# Patient Record
Sex: Female | Born: 1950
Health system: Southern US, Community
[De-identification: ages and names within clinical notes are randomized; demographics above are authoritative.]

## PROBLEM LIST (undated history)

## (undated) DIAGNOSIS — Z91018 Allergy to other foods: Secondary | ICD-10-CM

## (undated) HISTORY — PX: WRIST FRACTURE SURGERY: SHX121

## (undated) HISTORY — DX: Allergy to other foods: Z91.018

## (undated) HISTORY — PX: CHOLECYSTECTOMY: SHX55

---

## 2004-02-18 ENCOUNTER — Emergency Department (HOSPITAL_COMMUNITY): Admission: EM | Admit: 2004-02-18 | Discharge: 2004-02-18 | Payer: Self-pay | Admitting: Emergency Medicine

## 2004-08-28 ENCOUNTER — Ambulatory Visit (HOSPITAL_COMMUNITY): Admission: RE | Admit: 2004-08-28 | Discharge: 2004-08-28 | Payer: Self-pay | Admitting: Pulmonary Disease

## 2004-09-05 ENCOUNTER — Ambulatory Visit (HOSPITAL_COMMUNITY): Admission: RE | Admit: 2004-09-05 | Discharge: 2004-09-05 | Payer: Self-pay | Admitting: Pulmonary Disease

## 2004-09-24 ENCOUNTER — Encounter (INDEPENDENT_AMBULATORY_CARE_PROVIDER_SITE_OTHER): Payer: Self-pay | Admitting: General Surgery

## 2004-09-24 ENCOUNTER — Observation Stay (HOSPITAL_COMMUNITY): Admission: RE | Admit: 2004-09-24 | Discharge: 2004-09-25 | Payer: Self-pay | Admitting: General Surgery

## 2007-02-11 ENCOUNTER — Other Ambulatory Visit: Admission: RE | Admit: 2007-02-11 | Discharge: 2007-02-11 | Payer: Self-pay | Admitting: Obstetrics & Gynecology

## 2007-03-02 ENCOUNTER — Encounter: Admission: RE | Admit: 2007-03-02 | Discharge: 2007-03-02 | Payer: Self-pay | Admitting: Obstetrics & Gynecology

## 2007-06-27 ENCOUNTER — Emergency Department (HOSPITAL_COMMUNITY): Admission: EM | Admit: 2007-06-27 | Discharge: 2007-06-27 | Payer: Self-pay | Admitting: Emergency Medicine

## 2010-02-04 ENCOUNTER — Encounter: Payer: Self-pay | Admitting: Pulmonary Disease

## 2010-06-01 NOTE — H&P (Signed)
NAMESANTIAGO, GRAF             ACCOUNT NO.:  [id number]   MEDICAL RECORD NO.:  [id number]           PATIENT TYPE:   LOCATION:                                 FACILITY:   PHYSICIAN:  Dalia Heading, M.D.  DATE OF BIRTH:  06-19-1950   DATE OF ADMISSION:  DATE OF DISCHARGE:  LH                                HISTORY & PHYSICAL   CHIEF COMPLAINT:  Chronic cholecystitis.   HISTORY OF PRESENT ILLNESS:  The patient is a 60 year old white female who  is referred for evaluation and treatment of biliary colic secondary to  chronic cholecystectomy.  She has been having intermittent episodes of right  upper quadrant abdominal pain with radiation to the right flank, nausea,  indigestion, and bloating for many months.  Fatty foods make it worse.  No  fever, chills, or jaundice have been noted.   PAST MEDICAL HISTORY:  Unremarkable.   PAST SURGICAL HISTORY:  Unremarkable.   CURRENT MEDICATIONS:  None.   ALLERGIES:  No known drug allergies.   REVIEW OF SYSTEMS:  Noncontributory.   PHYSICAL EXAMINATION:  GENERAL:  The patient is a well-developed, well-  nourished, white female in no acute distress.  HEENT:  No scleral icterus.  LUNGS:  Clear to auscultation with equal breath sounds bilaterally.  HEART:  A regular rate and rhythm without S3, S4, or murmurs.  ABDOMEN:  Soft with slight tenderness in the right upper quadrant to  palpation.  No hepatosplenomegaly, masses, or hernias are identified.  Ultrasound of the gallbladder is negative.  Hepatobiliary scan reveals low  gallbladder ejection fraction with reproducible symptoms with a fatty meal.   IMPRESSION:  Chronic cholecystitis.   PLAN:  The patient is scheduled for laparoscopic cholecystectomy on  September 24, 2004.  The risks and benefits of the procedure, including  bleeding, infection, hepatobiliary injury, and the possibility of an open  procedure were fully explained to the patient, who gave informed consent.      Dalia Heading, M.D.  Electronically Signed     MAJ/MEDQ  D:  09/20/2004  T:  09/21/2004  Job:  [id number]   cc:   Ramon Dredge L. Juanetta Gosling, M.D.  [address]  [place]  [place] [address]  Fax: [phone]

## 2010-06-01 NOTE — Op Note (Signed)
Jasmin Perez, Jasmin Perez             ACCOUNT NO.:  [id number]   MEDICAL RECORD NO.:  [id number]          PATIENT TYPE:  OBV   LOCATION:  A416                          FACILITY:  [facility]   PHYSICIAN:  Dalia Heading, M.D.  DATE OF BIRTH:  01/30/1950   DATE OF PROCEDURE:  09/24/2004  DATE OF DISCHARGE:                                 OPERATIVE REPORT   PREOPERATIVE DIAGNOSIS:  Chronic cholecystitis.   POSTOPERATIVE DIAGNOSIS:  Chronic cholecystitis.   PROCEDURE:  Laparoscopic cholecystectomy.   SURGEON:  Dalia Heading, M.D.   ANESTHESIA:  General endotracheal.   INDICATIONS:  The patient is a 60 year old white female who was referred for  evaluation and treatment for biliary colic secondary to chronic  cholecystitis. Risks and benefits of the procedure including bleeding,  infection, hepatobiliary injury, and the possibility of an open procedure  were fully explained to the patient, who gave informed consent.   PROCEDURE NOTE:  The patient was placed in the supine position. After  induction of general endotracheal anesthesia, the abdomen was prepped and  draped using the usual sterile technique with Betadine. Surgical site  confirmation was performed.   An infraumbilical incision was made down to fascia. Veress needle was  introduced into the abdominal cavity and confirmation of placement was done  using the saline drop test. The abdomen was then insufflated to 16 mmHg  pressure. A 11-mm trocar was introduced into the abdominal cavity under  direct visualization without difficulty. The patient was placed in reversed  Trendelenburg position. Additional 1-mm trocar was placed in the epigastric  region and 5-mm trocars were placed in the right upper quadrant and right  flank regions. Liver was inspected, noted to be within normal limits. The  gallbladder was retracted superiorly and laterally. The dissection was begun  around the infundibulum of the gallbladder. Cystic duct was  first  identified. Its juncture to the infundibulum fully identified. Endoclips  were placed proximally and distally on the cystic duct and the cystic duct  was divided. This was likewise done in the cystic artery. The gallbladder  was then freed away from the gallbladder fossa using Bovie electrocautery.  The gallbladder was delivered through the epigastric trocar site using  EndoCatch bag. The gallbladder fossa was inspected. No abnormal bleeding or  bile leakage was noted. Surgicel was placed in the gallbladder fossa. All  fluid and air were then evacuated from the abdominal cavity prior to removal  of the trocars.   All wounds were irrigated with normal saline. All wounds were injected with  0.5% Sensorcaine. The infraumbilical fascia was reapproximated using a 0  Vicryl interrupted suture. All skin incisions were closed using staples.  Betadine ointment and dry sterile dressings were applied.   All tape and needle counts were correct at the end of the procedure. The  patient was extubated in the operating room and went back to recovery room  awake and in stable condition.   COMPLICATIONS:  None.   SPECIMEN:  Gallbladder.   ESTIMATED BLOOD LOSS:  Minimal.      Dalia Heading, M.D.  Electronically Signed  MAJ/MEDQ  D:  09/24/2004  T:  09/24/2004  Job:  [id number]   cc:   Ramon Dredge L. Juanetta Gosling, M.D.  [address]  [place]  [place] [address]  Fax: [phone]

## 2011-10-24 ENCOUNTER — Ambulatory Visit: Payer: Self-pay | Admitting: General Practice

## 2012-12-30 ENCOUNTER — Ambulatory Visit: Payer: Self-pay | Admitting: Family Medicine

## 2014-12-13 DIAGNOSIS — Z0000 Encounter for general adult medical examination without abnormal findings: Secondary | ICD-10-CM | POA: Diagnosis not present

## 2014-12-13 DIAGNOSIS — Z6824 Body mass index (BMI) 24.0-24.9, adult: Secondary | ICD-10-CM | POA: Diagnosis not present

## 2014-12-13 DIAGNOSIS — E782 Mixed hyperlipidemia: Secondary | ICD-10-CM | POA: Diagnosis not present

## 2015-08-08 ENCOUNTER — Encounter (HOSPITAL_COMMUNITY): Payer: Self-pay | Admitting: *Deleted

## 2015-08-08 ENCOUNTER — Emergency Department (HOSPITAL_COMMUNITY): Payer: Commercial Managed Care - HMO

## 2015-08-08 ENCOUNTER — Emergency Department (HOSPITAL_COMMUNITY)
Admission: EM | Admit: 2015-08-08 | Discharge: 2015-08-08 | Disposition: A | Discharge status: Home / Self Care | Payer: Commercial Managed Care - HMO | Attending: Emergency Medicine | Admitting: Emergency Medicine

## 2015-08-08 DIAGNOSIS — R1010 Upper abdominal pain, unspecified: Secondary | ICD-10-CM | POA: Diagnosis not present

## 2015-08-08 DIAGNOSIS — R1011 Right upper quadrant pain: Secondary | ICD-10-CM | POA: Insufficient documentation

## 2015-08-08 DIAGNOSIS — Z79899 Other long term (current) drug therapy: Secondary | ICD-10-CM | POA: Diagnosis not present

## 2015-08-08 LAB — CBC WITH DIFFERENTIAL/PLATELET
BASOS ABS: 0 10*3/uL (ref 0.0–0.1)
BASOS PCT: 1 %
Eosinophils Absolute: 0.2 10*3/uL (ref 0.0–0.7)
Eosinophils Relative: 4 %
HEMATOCRIT: 43.5 % (ref 36.0–46.0)
HEMOGLOBIN: 14.8 g/dL (ref 12.0–15.0)
Lymphocytes Relative: 37 %
Lymphs Abs: 1.7 10*3/uL (ref 0.7–4.0)
MCH: 29.2 pg (ref 26.0–34.0)
MCHC: 34 g/dL (ref 30.0–36.0)
MCV: 85.8 fL (ref 78.0–100.0)
MONOS PCT: 11 %
Monocytes Absolute: 0.5 10*3/uL (ref 0.1–1.0)
NEUTROS ABS: 2.1 10*3/uL (ref 1.7–7.7)
NEUTROS PCT: 47 %
Platelets: 184 10*3/uL (ref 150–400)
RBC: 5.07 MIL/uL (ref 3.87–5.11)
RDW: 13.6 % (ref 11.5–15.5)
WBC: 4.5 10*3/uL (ref 4.0–10.5)

## 2015-08-08 LAB — URINE MICROSCOPIC-ADD ON
Squamous Epithelial / LPF: NONE SEEN
WBC UA: NONE SEEN WBC/hpf (ref 0–5)

## 2015-08-08 LAB — COMPREHENSIVE METABOLIC PANEL
ALT: 19 U/L (ref 14–54)
AST: 23 U/L (ref 15–41)
Albumin: 4.2 g/dL (ref 3.5–5.0)
Alkaline Phosphatase: 67 U/L (ref 38–126)
BUN: 19 mg/dL (ref 6–20)
CALCIUM: 9.1 mg/dL (ref 8.9–10.3)
CHLORIDE: 108 mmol/L (ref 101–111)
CO2: 28 mmol/L (ref 22–32)
Creatinine, Ser: 0.71 mg/dL (ref 0.44–1.00)
Glucose, Bld: 89 mg/dL (ref 65–99)
POTASSIUM: 3.7 mmol/L (ref 3.5–5.1)
Sodium: 137 mmol/L (ref 135–145)
Total Bilirubin: 0.6 mg/dL (ref 0.3–1.2)
Total Protein: 6.9 g/dL (ref 6.5–8.1)

## 2015-08-08 LAB — URINALYSIS, ROUTINE W REFLEX MICROSCOPIC
Bilirubin Urine: NEGATIVE
Glucose, UA: NEGATIVE mg/dL
KETONES UR: NEGATIVE mg/dL
LEUKOCYTES UA: NEGATIVE
NITRITE: NEGATIVE
PH: 5.5 (ref 5.0–8.0)
Protein, ur: NEGATIVE mg/dL
SPECIFIC GRAVITY, URINE: 1.025 (ref 1.005–1.030)

## 2015-08-08 LAB — LIPASE, BLOOD: LIPASE: 28 U/L (ref 11–51)

## 2015-08-08 MED ORDER — PANTOPRAZOLE SODIUM 20 MG PO TBEC: 20.0000 mg | DELAYED_RELEASE_TABLET | Freq: Every day | ORAL | 0 refills | Status: DC

## 2015-08-08 MED ORDER — IOPAMIDOL (ISOVUE-300) INJECTION 61%
100.0000 mL | Freq: Once | INTRAVENOUS | Status: AC | PRN
  Administered 2015-08-08: 100 mL via INTRAVENOUS

## 2015-08-08 NOTE — ED Triage Notes (Signed)
Pt c/o right upper quad pain that has gotten progressively worse; pt states the pain is worse when she lies on her stomach; pt denies any n/v or urinary sx

## 2015-08-08 NOTE — ED Provider Notes (Signed)
[facility] DEPT Provider Note   CSN: [id number] Arrival date & time: 08/08/15  0607  First Provider Contact:  First MD Initiated Contact with Patient 08/08/15 0745        History   Chief Complaint Chief Complaint  Patient presents with  . Abdominal Pain    HPI Jasmin Perez is a 65 y.o. female.  Patient complains of some right upper quadrant pain for couple weeks. No fever no nausea no vomiting. Patient has had her gallbladder out   The history is provided by the patient.  Abdominal Pain   This is a new problem. The current episode started more than 1 week ago. The problem occurs constantly. The problem has not changed since onset.The pain is associated with an unknown factor. The pain is located in the RUQ. The pain is at a severity of 4/10. The pain is moderate. Pertinent negatives include anorexia, diarrhea, frequency, hematuria and headaches. Nothing aggravates the symptoms. Nothing relieves the symptoms. Past workup does not include GI consult. Her past medical history is significant for gallstones.    History reviewed. No pertinent past medical history.  There are no active problems to display for this patient.   Past Surgical History:  Procedure Laterality Date  . CHOLECYSTECTOMY    . WRIST FRACTURE SURGERY Right     OB History    No data available       Home Medications    Prior to Admission medications   Medication Sig Start Date End Date Taking? Authorizing Provider  calcium-vitamin D (OSCAL WITH D) 500-200 MG-UNIT tablet Take 1 tablet by mouth daily.   Yes Historical Provider, MD  Cholecalciferol (VITAMIN D PO) Take 1 tablet by mouth daily.   Yes Historical Provider, MD  magnesium oxide (MAG-OX) 400 MG tablet Take 400 mg by mouth daily.   Yes Historical Provider, MD  Multiple Vitamins-Minerals (MULTIVITAMINS THER. W/MINERALS) TABS tablet Take 1 tablet by mouth daily.   Yes Historical Provider, MD  Multiple Vitamins-Minerals (ZINC PO) Take 1  tablet by mouth daily.   Yes Historical Provider, MD  VITAMIN E PO Take 1 tablet by mouth daily.   Yes Historical Provider, MD  pantoprazole (PROTONIX) 20 MG tablet Take 1 tablet (20 mg total) by mouth daily. 08/08/15   Milton Ferguson, MD    Family History History reviewed. No pertinent family history.  Social History Social History  Substance Use Topics  . Smoking status: Never Smoker  . Smokeless tobacco: Never Used  . Alcohol use No     Allergies   Peanut-containing drug products   Review of Systems Review of Systems  Constitutional: Negative for appetite change and fatigue.  HENT: Negative for congestion, ear discharge and sinus pressure.   Eyes: Negative for discharge.  Respiratory: Negative for cough.   Cardiovascular: Negative for chest pain.  Gastrointestinal: Positive for abdominal pain. Negative for anorexia and diarrhea.  Genitourinary: Negative for frequency and hematuria.  Musculoskeletal: Negative for back pain.  Skin: Negative for rash.  Neurological: Negative for seizures and headaches.  Psychiatric/Behavioral: Negative for hallucinations.     Physical Exam Updated Vital Signs BP 114/63 (BP Location: Left Arm)   Pulse 60   Temp 98.3 F (36.8 C) (Oral)   Resp 18   Ht 5\' 5"  (1.651 m)   Wt 150 lb (68 kg)   SpO2 100%   BMI 24.96 kg/m   Physical Exam  Constitutional: She is oriented to person, place, and time. She appears well-developed.  HENT:  Head: Normocephalic.  Eyes: Conjunctivae and EOM are normal. No scleral icterus.  Neck: Neck supple. No thyromegaly present.  Cardiovascular: Normal rate and regular rhythm.  Exam reveals no gallop and no friction rub.   No murmur heard. Pulmonary/Chest: No stridor. She has no wheezes. She has no rales. She exhibits no tenderness.  Abdominal: She exhibits no distension. There is no tenderness. There is no rebound.  Mild right upper quadrant tenderness  Musculoskeletal: Normal range of motion. She exhibits  no edema.  Lymphadenopathy:    She has no cervical adenopathy.  Neurological: She is oriented to person, place, and time. She exhibits normal muscle tone. Coordination normal.  Skin: No rash noted. No erythema.  Psychiatric: She has a normal mood and affect. Her behavior is normal.     ED Treatments / Results  Labs (all labs ordered are listed, but only abnormal results are displayed) Labs Reviewed  URINALYSIS, ROUTINE W REFLEX MICROSCOPIC (NOT AT [facility]) - Abnormal; Notable for the following:       Result Value   Hgb urine dipstick MODERATE (*)    All other components within normal limits  URINE MICROSCOPIC-ADD ON - Abnormal; Notable for the following:    Bacteria, UA FEW (*)    All other components within normal limits  URINE CULTURE  LIPASE, BLOOD  COMPREHENSIVE METABOLIC PANEL  CBC WITH DIFFERENTIAL/PLATELET    EKG  EKG Interpretation None       Radiology Ct Abdomen Pelvis W Contrast  Result Date: 08/08/2015 CLINICAL DATA:  Right upper quadrant pain for 3 weeks. EXAM: CT ABDOMEN AND PELVIS WITH CONTRAST TECHNIQUE: Multidetector CT imaging of the abdomen and pelvis was performed using the standard protocol following bolus administration of intravenous contrast. CONTRAST:  143mL ISOVUE-300 IOPAMIDOL (ISOVUE-300) INJECTION 61% COMPARISON:  None. FINDINGS: Lower chest: Bibasilar scar. Mild cardiomegaly, without pericardial or pleural effusion. Hepatobiliary: Too small to characterize right hepatic lobe lesion on image 18/series 2 measures on the order of 6 mm. Pancreas: Normal, without mass or ductal dilatation. Spleen: Normal in size, without focal abnormality. Adrenals/Urinary Tract: Normal adrenal glands. Normal kidneys, without hydronephrosis. Normal urinary bladder. Stomach/Bowel: Normal stomach, without wall thickening. Colonic stool burden suggests constipation. A subtle small bowel intussusception on image 28/series 2 and coronal image 47 is likely transient, not identified  on delayed images. Vascular/Lymphatic: Aortic and branch vessel atherosclerosis. No abdominopelvic adenopathy. Reproductive: Normal uterus and adnexa. Other: No significant free fluid. Musculoskeletal: Likely degenerative or remote posttraumatic sclerosis about the posterior left iliac. Degenerative disc disease at the lumbosacral junction with trace L4-5 anterolisthesis. Disc bulges at L4-5 and L5-S1. Mild convex right lumbar spine curvature IMPRESSION: 1.  Possible constipation. 2. No other explanation for right-sided pain. 3.  Aortic atherosclerosis. Electronically Signed   By: Abigail Miyamoto M.D.   On: 08/08/2015 13:44   Procedures Procedures (including critical care time)  Medications Ordered in ED Medications  iopamidol (ISOVUE-300) 61 % injection 100 mL (100 mLs Intravenous Contrast Given 08/08/15 1310)     Initial Impression / Assessment and Plan / ED Course  I have reviewed the triage vital signs and the nursing notes.  Pertinent labs & imaging results that were available during my care of the patient were reviewed by me and considered in my medical decision making (see chart for details).  Clinical Course    Labs including CT of abdomen negative except for some blood in her urine. Patient will have urine culture done be put on a PPI and will follow-up  with PCP  Final Clinical Impressions(s) / ED Diagnoses   Final diagnoses:  Pain of upper abdomen    New Prescriptions New Prescriptions   PANTOPRAZOLE (PROTONIX) 20 MG TABLET    Take 1 tablet (20 mg total) by mouth daily.     Milton Ferguson, MD 08/08/15 1409

## 2015-08-08 NOTE — Discharge Instructions (Signed)
Follow up with your md as planned °

## 2015-08-10 LAB — URINE CULTURE

## 2015-08-24 DIAGNOSIS — R1030 Lower abdominal pain, unspecified: Secondary | ICD-10-CM | POA: Diagnosis not present

## 2015-10-16 DIAGNOSIS — E785 Hyperlipidemia, unspecified: Secondary | ICD-10-CM | POA: Diagnosis not present

## 2015-10-16 DIAGNOSIS — Z0000 Encounter for general adult medical examination without abnormal findings: Secondary | ICD-10-CM | POA: Diagnosis not present

## 2015-11-06 DIAGNOSIS — Z0000 Encounter for general adult medical examination without abnormal findings: Secondary | ICD-10-CM | POA: Diagnosis not present

## 2016-03-05 DIAGNOSIS — K1120 Sialoadenitis, unspecified: Secondary | ICD-10-CM | POA: Diagnosis not present

## 2016-03-08 ENCOUNTER — Other Ambulatory Visit (HOSPITAL_COMMUNITY): Payer: Self-pay | Admitting: Pulmonary Disease

## 2016-03-11 ENCOUNTER — Other Ambulatory Visit (HOSPITAL_COMMUNITY): Payer: Self-pay | Admitting: Pulmonary Disease

## 2016-03-11 DIAGNOSIS — K119 Disease of salivary gland, unspecified: Secondary | ICD-10-CM

## 2016-03-20 ENCOUNTER — Ambulatory Visit (HOSPITAL_COMMUNITY)
Admission: RE | Admit: 2016-03-20 | Discharge: 2016-03-20 | Disposition: A | Discharge status: Home / Self Care | Payer: Medicare HMO | Source: Ambulatory Visit | Attending: Pulmonary Disease | Admitting: Pulmonary Disease

## 2016-03-20 DIAGNOSIS — K119 Disease of salivary gland, unspecified: Secondary | ICD-10-CM

## 2016-03-20 DIAGNOSIS — J984 Other disorders of lung: Secondary | ICD-10-CM | POA: Diagnosis not present

## 2016-03-20 DIAGNOSIS — R221 Localized swelling, mass and lump, neck: Secondary | ICD-10-CM | POA: Diagnosis not present

## 2016-03-20 LAB — POCT I-STAT CREATININE: Creatinine, Ser: 0.8 mg/dL (ref 0.44–1.00)

## 2016-03-20 MED ORDER — IOPAMIDOL (ISOVUE-300) INJECTION 61%
75.0000 mL | Freq: Once | INTRAVENOUS | Status: AC | PRN
  Administered 2016-03-20: 75 mL via INTRAVENOUS

## 2016-08-12 DIAGNOSIS — B029 Zoster without complications: Secondary | ICD-10-CM | POA: Diagnosis not present

## 2016-08-14 DIAGNOSIS — B029 Zoster without complications: Secondary | ICD-10-CM | POA: Diagnosis not present

## 2016-11-28 DIAGNOSIS — L2084 Intrinsic (allergic) eczema: Secondary | ICD-10-CM | POA: Diagnosis not present

## 2016-11-28 DIAGNOSIS — R194 Change in bowel habit: Secondary | ICD-10-CM | POA: Diagnosis not present

## 2016-11-28 DIAGNOSIS — E785 Hyperlipidemia, unspecified: Secondary | ICD-10-CM | POA: Diagnosis not present

## 2016-11-28 DIAGNOSIS — K210 Gastro-esophageal reflux disease with esophagitis: Secondary | ICD-10-CM | POA: Diagnosis not present

## 2016-11-28 DIAGNOSIS — R196 Halitosis: Secondary | ICD-10-CM | POA: Diagnosis not present

## 2016-11-28 DIAGNOSIS — R197 Diarrhea, unspecified: Secondary | ICD-10-CM | POA: Diagnosis not present

## 2016-11-29 DIAGNOSIS — R196 Halitosis: Secondary | ICD-10-CM | POA: Diagnosis not present

## 2016-11-29 DIAGNOSIS — R194 Change in bowel habit: Secondary | ICD-10-CM | POA: Diagnosis not present

## 2016-11-29 DIAGNOSIS — E785 Hyperlipidemia, unspecified: Secondary | ICD-10-CM | POA: Diagnosis not present

## 2016-11-29 DIAGNOSIS — L2084 Intrinsic (allergic) eczema: Secondary | ICD-10-CM | POA: Diagnosis not present

## 2016-11-29 DIAGNOSIS — K210 Gastro-esophageal reflux disease with esophagitis: Secondary | ICD-10-CM | POA: Diagnosis not present

## 2016-11-29 DIAGNOSIS — R197 Diarrhea, unspecified: Secondary | ICD-10-CM | POA: Diagnosis not present

## 2016-12-12 ENCOUNTER — Encounter (INDEPENDENT_AMBULATORY_CARE_PROVIDER_SITE_OTHER): Payer: Self-pay | Admitting: Internal Medicine

## 2016-12-26 ENCOUNTER — Encounter (INDEPENDENT_AMBULATORY_CARE_PROVIDER_SITE_OTHER): Payer: Self-pay

## 2016-12-26 ENCOUNTER — Encounter (INDEPENDENT_AMBULATORY_CARE_PROVIDER_SITE_OTHER): Payer: Self-pay | Admitting: Internal Medicine

## 2016-12-26 ENCOUNTER — Ambulatory Visit (INDEPENDENT_AMBULATORY_CARE_PROVIDER_SITE_OTHER): Payer: Commercial Managed Care - HMO | Admitting: Internal Medicine

## 2016-12-26 VITALS — BP 110/80 | HR 64 | Temp 97.8°F | Resp 18 | Ht 64.5 in | Wt 155.0 lb

## 2016-12-26 DIAGNOSIS — Z800 Family history of malignant neoplasm of digestive organs: Secondary | ICD-10-CM

## 2016-12-26 DIAGNOSIS — R195 Other fecal abnormalities: Secondary | ICD-10-CM

## 2016-12-26 NOTE — Progress Notes (Signed)
   Subjective:    Patient ID: Jasmin Perez, female    DOB: [birth year], 66 y.o.   MRN: [id number]  HPI Referred by Dr. Luan Pulling for change in stool/diarrhea. She states she has white organisms in her stool.  She tells the organisms are sticking out of her stool (pictures) Change in her stools for about 4 weeks. She has a BM every 2 days.  Stools are nice and formed except for these white strings in her stools. She sees the string like projections every time she has a BM.  Her appetite is good. No weight loss. She thinks she may have seen some blood a couple of weeks ago.  She has never had a colonoscopy. Stool test from [facility] was negative in September. Nephew had intestinal cancer at age 23 and is now deceased from same.   Has been in [place] and [place] around the last of August and 1st of September. In September she was in [place] for a week. 12/03/2016 Stool studies negative. Ova and parasites negative. 11/28/2016 H and H  14.0 and 41.9.    08/07/2016 CT abdomen/pelvis with CM: RUQ pain; IMPRESSION: 1.  Possible constipation. 2. No other explanation for right-sided pain. 3.  Aortic atherosclerosis. Review of Systems History reviewed. No pertinent past medical history.  Past Surgical History:  Procedure Laterality Date  . CHOLECYSTECTOMY    . WRIST FRACTURE SURGERY Right     Allergies  Allergen Reactions  . Peanut-Containing Drug Products     Current Outpatient Medications on File Prior to Visit  Medication Sig Dispense Refill  . calcium-vitamin D (OSCAL WITH D) 500-200 MG-UNIT tablet Take 1 tablet by mouth daily.    . Cholecalciferol (VITAMIN D PO) Take 1 tablet by mouth daily.    . magnesium oxide (MAG-OX) 400 MG tablet Take 400 mg by mouth daily.    . Multiple Vitamins-Minerals (MULTIVITAMINS THER. W/MINERALS) TABS tablet Take 1 tablet by mouth daily.    . Multiple Vitamins-Minerals (ZINC PO) Take 1 tablet by mouth daily.    . vitamin C (ASCORBIC ACID) 500 MG tablet  Take by mouth daily.    Marland Kitchen VITAMIN E PO Take 1 tablet by mouth daily.     No current facility-administered medications on file prior to visit.         Objective:   Physical Exam Blood pressure 110/80, pulse 64, temperature 97.8 F (36.6 C), temperature source Oral, resp. rate 18, height 5' 4.5" (1.638 m), weight 155 lb (70.3 kg). Alert and oriented. Skin warm and dry. Oral mucosa is moist.   . Sclera anicteric, conjunctivae is pink. Thyroid not enlarged. No cervical lymphadenopathy. Lungs clear. Heart regular rate and rhythm.  Abdomen is soft. Bowel sounds are positive. No hepatomegaly. No abdominal masses felt. No tenderness.  No edema to lower extremities.           Assessment & Plan:  Change in stool. Will get an Ova and Para. Family hx of colon cancer. Needs to consider a colonoscopy at some point.

## 2016-12-26 NOTE — Patient Instructions (Addendum)
Ova and Para.

## 2016-12-27 LAB — OVA AND PARASITE EXAMINATION
CONCENTRATE RESULT: NONE SEEN
SPECIMEN QUALITY:: ADEQUATE
TRICHROME RESULT: NONE SEEN
VKL: 81402336

## 2017-01-09 DIAGNOSIS — R197 Diarrhea, unspecified: Secondary | ICD-10-CM | POA: Diagnosis not present

## 2017-01-23 ENCOUNTER — Telehealth (INDEPENDENT_AMBULATORY_CARE_PROVIDER_SITE_OTHER): Payer: Self-pay | Admitting: Internal Medicine

## 2017-01-23 NOTE — Telephone Encounter (Signed)
Patient called lmoam regarding several results listed in [place].  [phone]

## 2017-01-27 ENCOUNTER — Telehealth (INDEPENDENT_AMBULATORY_CARE_PROVIDER_SITE_OTHER): Payer: Self-pay | Admitting: Internal Medicine

## 2017-01-27 ENCOUNTER — Other Ambulatory Visit (INDEPENDENT_AMBULATORY_CARE_PROVIDER_SITE_OTHER): Payer: Self-pay | Admitting: Internal Medicine

## 2017-01-27 DIAGNOSIS — R195 Other fecal abnormalities: Secondary | ICD-10-CM

## 2017-01-27 NOTE — Telephone Encounter (Signed)
Patient presented to the office today and [name] spoke to her.

## 2017-01-27 NOTE — Telephone Encounter (Signed)
err

## 2017-01-27 NOTE — Telephone Encounter (Signed)
Stool study ordered

## 2017-01-28 DIAGNOSIS — R195 Other fecal abnormalities: Secondary | ICD-10-CM | POA: Diagnosis not present

## 2017-01-29 LAB — GIARDIA/CRYPTOSPORIDIUM (EIA)
MICRO NUMBER: 90060166
MICRO NUMBER: 90060167
RESULT: NOT DETECTED
RESULT:: NOT DETECTED
SPECIMEN QUALITY: ADEQUATE
SPECIMEN QUALITY:: ADEQUATE

## 2017-02-11 ENCOUNTER — Other Ambulatory Visit (INDEPENDENT_AMBULATORY_CARE_PROVIDER_SITE_OTHER): Payer: Self-pay | Admitting: Internal Medicine

## 2017-02-11 DIAGNOSIS — R1011 Right upper quadrant pain: Secondary | ICD-10-CM | POA: Diagnosis not present

## 2017-02-11 DIAGNOSIS — R1013 Epigastric pain: Secondary | ICD-10-CM | POA: Diagnosis not present

## 2017-02-11 LAB — COMPREHENSIVE METABOLIC PANEL
AG Ratio: 2 (calc) (ref 1.0–2.5)
ALKALINE PHOSPHATASE (APISO): 82 U/L (ref 33–130)
ALT: 15 U/L (ref 6–29)
AST: 19 U/L (ref 10–35)
Albumin: 4.7 g/dL (ref 3.6–5.1)
BUN/Creatinine Ratio: 16 (calc) (ref 6–22)
BUN: 18 mg/dL (ref 7–25)
CALCIUM: 9.6 mg/dL (ref 8.6–10.4)
CHLORIDE: 107 mmol/L (ref 98–110)
CO2: 28 mmol/L (ref 20–32)
CREATININE: 1.14 mg/dL — AB (ref 0.50–0.99)
GLOBULIN: 2.4 g/dL (ref 1.9–3.7)
Glucose, Bld: 101 mg/dL — ABNORMAL HIGH (ref 65–99)
Potassium: 4.3 mmol/L (ref 3.5–5.3)
Sodium: 144 mmol/L (ref 135–146)
Total Bilirubin: 0.6 mg/dL (ref 0.2–1.2)
Total Protein: 7.1 g/dL (ref 6.1–8.1)

## 2017-02-11 LAB — CBC WITH DIFFERENTIAL/PLATELET
BASOS ABS: 30 {cells}/uL (ref 0–200)
Basophils Relative: 0.4 %
EOS PCT: 3.7 %
Eosinophils Absolute: 281 cells/uL (ref 15–500)
HCT: 42.4 % (ref 35.0–45.0)
Hemoglobin: 14.7 g/dL (ref 11.7–15.5)
Lymphs Abs: 1611 cells/uL (ref 850–3900)
MCH: 28.7 pg (ref 27.0–33.0)
MCHC: 34.7 g/dL (ref 32.0–36.0)
MCV: 82.8 fL (ref 80.0–100.0)
MONOS PCT: 7.5 %
MPV: 9.3 fL (ref 7.5–12.5)
NEUTROS ABS: 5107 {cells}/uL (ref 1500–7800)
Neutrophils Relative %: 67.2 %
PLATELETS: 268 10*3/uL (ref 140–400)
RBC: 5.12 10*6/uL — ABNORMAL HIGH (ref 3.80–5.10)
RDW: 13 % (ref 11.0–15.0)
TOTAL LYMPHOCYTE: 21.2 %
WBC mixed population: 570 cells/uL (ref 200–950)
WBC: 7.6 10*3/uL (ref 3.8–10.8)

## 2017-02-11 LAB — AMYLASE: AMYLASE: 37 U/L (ref 21–101)

## 2017-02-11 LAB — LIPASE: Lipase: 21 U/L (ref 7–60)

## 2017-02-24 ENCOUNTER — Ambulatory Visit: Payer: Medicare HMO | Admitting: Infectious Diseases

## 2017-03-03 ENCOUNTER — Ambulatory Visit: Payer: Medicare HMO | Admitting: Infectious Diseases

## 2017-03-03 ENCOUNTER — Encounter: Payer: Self-pay | Admitting: Infectious Diseases

## 2017-03-03 DIAGNOSIS — F22 Delusional disorders: Secondary | ICD-10-CM | POA: Insufficient documentation

## 2017-03-03 DIAGNOSIS — Z0000 Encounter for general adult medical examination without abnormal findings: Secondary | ICD-10-CM

## 2017-03-03 NOTE — Assessment & Plan Note (Addendum)
She has had multiple negative tests.  She shows me a picture on her phone of a worm as well has brought in her stool (~ 20 bags in ziplocks).  She gives me one which has nuts in it and food detritus. I will send this to the labs (she denies eating nuts).  She needs a colonoscopy due to her age and possible for further eval of her stool I will ask her PCP to sen dher to GI and strongly consider psychiatric eval.  Will see her back only if her path specimen is positive.  I will call her if her specimen is positive, otherwise she does not have a f/u appt with [place].

## 2017-03-03 NOTE — Progress Notes (Signed)
   Subjective:    Patient ID: Jasmin Perez, female    DOB: [birth year], 67 y.o.   MRN: [id number]  HPI 67 yo F with belief that she has GI parasites. She was seen 11-2016 by her PCP and had negative O & P. She was then seen by GI and again had negative O & P. She was recommended to have colonoscopy.  Has a nephew with colon cancer her recently died of colon cancer.  She then was seen by PCP again had negative O & P (12-26-16 and 01-12-17).  Previously, symptomatically she has had flatulence, mucus in stool, and loose BM (every 3 days) when she would have "clean out".  Stool has been more normal recently. Not everyday.   She has dogs and knows what worms look like.  She looked on line and felt like her worm was most consistent with a fluke and that she needs flagyl.  Has never lived overseas. Has been to [facility] (end of August). Denies raw foods, raw milk.  She took "woodworm" from health food store.   The past medical history, family history and social history were reviewed/updated in EPIC   Review of Systems  Constitutional: Negative for appetite change, chills, fever and unexpected weight change.  Respiratory: Negative for cough and shortness of breath.   Cardiovascular: Negative for chest pain and leg swelling.  Gastrointestinal: Positive for diarrhea. Negative for constipation.       Bloating.   Genitourinary: Negative for difficulty urinating.  Skin: Positive for rash.  Psychiatric/Behavioral: Negative for dysphoric mood and sleep disturbance. The patient is nervous/anxious.   has been eating carrots to try and get rid of worms.  Please see HPI. All other systems reviewed and negative.     Objective:   Physical Exam  Constitutional: She is oriented to person, place, and time. She appears well-developed and well-nourished.  HENT:  Mouth/Throat: No oropharyngeal exudate.  Eyes: EOM are normal.  Neck: Neck supple.  Cardiovascular: Normal rate, regular rhythm and normal heart  sounds.  Pulmonary/Chest: Effort normal and breath sounds normal.  Abdominal: Soft. Bowel sounds are normal. There is no tenderness. There is no rebound.  Musculoskeletal: Normal range of motion.  Lymphadenopathy:    She has no cervical adenopathy.  Neurological: She is alert and oriented to person, place, and time.  Skin:  mild eczema between fingers.   Psychiatric: She has a normal mood and affect.      Assessment & Plan:

## 2017-03-04 ENCOUNTER — Telehealth: Payer: Self-pay

## 2017-03-04 NOTE — Telephone Encounter (Signed)
Patient was calling to give Dr Johnnye Sima information for fluke testing.  CDC - [phone]  Laverle Patter, RN

## 2017-03-06 NOTE — Telephone Encounter (Signed)
I see no indication for testing.  She can f/u with her PCP

## 2017-03-06 NOTE — Telephone Encounter (Signed)
Patient called to check and see if we have found anything out about how she is to get tested through the [facility]. Advised no note in yet but will resend and find out and someone will give her a call back.

## 2017-03-07 NOTE — Telephone Encounter (Signed)
Patient called to see what next steps are regarding her testing.  She says this emotionally draining and is causing extreme bloating. She is feeling so bad she may go to the emergency room and feels something needs to be done soon.  She would like to know if Dr Johnnye Sima will do testing or if she needs to go to [facility].  Informed patient he would not be returning to the office until next week and I will make sure he receives this message.   I did not inform patient of response from Dr Johnnye Sima on 03-06-17. Based on emotions of the patient at the time  the conversation I did not feel comfortable telling her the test was not going to be ordered.   Laverle Patter, RN

## 2017-03-10 ENCOUNTER — Telehealth: Payer: Self-pay | Admitting: Infectious Diseases

## 2017-03-10 NOTE — Telephone Encounter (Signed)
I called and left VM that no further testing is planned.  Please see phone note (she can drop off stool test if she desires) o/w f/u with PCP.

## 2017-03-10 NOTE — Telephone Encounter (Signed)
Called pt and left VM that no further testing was planned.  Offered to send her stool sample to path.  Otherwise, she needs to speak with her PCP regarding referral to [facility] if that is her desire.  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

## 2017-04-08 DIAGNOSIS — E789 Disorder of lipoprotein metabolism, unspecified: Secondary | ICD-10-CM | POA: Diagnosis not present

## 2017-04-08 DIAGNOSIS — R197 Diarrhea, unspecified: Secondary | ICD-10-CM | POA: Diagnosis not present

## 2017-04-08 DIAGNOSIS — K219 Gastro-esophageal reflux disease without esophagitis: Secondary | ICD-10-CM | POA: Diagnosis not present

## 2017-05-09 DIAGNOSIS — R197 Diarrhea, unspecified: Secondary | ICD-10-CM | POA: Diagnosis not present

## 2017-05-16 ENCOUNTER — Encounter (INDEPENDENT_AMBULATORY_CARE_PROVIDER_SITE_OTHER): Payer: Self-pay | Admitting: Internal Medicine

## 2017-05-16 ENCOUNTER — Ambulatory Visit (INDEPENDENT_AMBULATORY_CARE_PROVIDER_SITE_OTHER): Payer: Medicare HMO | Admitting: Internal Medicine

## 2017-05-16 VITALS — BP 138/80 | HR 72 | Temp 98.1°F | Ht 65.0 in | Wt 155.8 lb

## 2017-05-16 DIAGNOSIS — Z800 Family history of malignant neoplasm of digestive organs: Secondary | ICD-10-CM | POA: Diagnosis not present

## 2017-05-16 NOTE — Progress Notes (Signed)
   Subjective:    Patient ID: Jasmin Perez, female    DOB: 26-Feb-1950, 67 y.o.   MRN: [id number]  HPI Here today to schedule a colonoscopy. She has never undergone a colonoscopy in the past. She says she has had some diarrhea.  She said she finished the Pinworm medication in March.  She says her stools were better afterwards. She says she has taken a second dose of the Pinworm medication and her stools are much better. On average she a BM daily. Stools are formed at this time. Her appetite is good. No weight loss. No abdominal pain.  She was last seen in December as a new patient. She was c/o change in stool/diarrhea.  She also c/o of organisms in her stools. All her stool studies came back negative.  Has been evaluated by [facility] Rumpf [facility]. Tests were negative at office  03/03/2017 Ova and para negative.  12/03/2016 Stool studies negative. Ova and parasites negative. 01/28/2017 Stool Giardia/Cryptosporidium negative.  12/26/2016 Ov and Para negative. 11/29/2017 E. Coli Shiga Toxin negative   Nephew had intestinal cancer at age 46 and is now deceased from same.    09/06/2016 CT abdomen/pelvis with CM: RUQ pain; IMPRESSION: 1. Possible constipation. 2. No other explanation for right-sided pain. 3. Aortic atherosclerosis.    Review of Systems Past Medical History:  Diagnosis Date  . Past history of nut allergy     Past Surgical History:  Procedure Laterality Date  . CHOLECYSTECTOMY    . WRIST FRACTURE SURGERY Right     Allergies  Allergen Reactions  . Peanut-Containing Drug Products     Current Outpatient Medications on File Prior to Visit  Medication Sig Dispense Refill  . calcium-vitamin D (OSCAL WITH D) 500-200 MG-UNIT tablet Take 1 tablet by mouth daily.    . Cholecalciferol (VITAMIN D PO) Take 1 tablet by mouth daily.    . magnesium oxide (MAG-OX) 400 MG tablet Take 400 mg by mouth daily.    . Multiple Vitamins-Minerals  (MULTIVITAMINS THER. W/MINERALS) TABS tablet Take 1 tablet by mouth daily.    . Omega-3 Fatty Acids (FISH OIL) 1000 MG CAPS Take by mouth.    . vitamin C (ASCORBIC ACID) 500 MG tablet Take by mouth daily.    Marland Kitchen VITAMIN E PO Take 1 tablet by mouth daily.    . Zinc 100 MG TABS Take by mouth.     No current facility-administered medications on file prior to visit.         Objective:   Physical Exam Blood pressure 138/80, pulse 72, temperature 98.1 F (36.7 C), height 5\' 5"  (1.651 m), weight 155 lb 12.8 oz (70.7 kg). Alert and oriented. Skin warm and dry. Oral mucosa is moist.   . Sclera anicteric, conjunctivae is pink. Thyroid not enlarged. No cervical lymphadenopathy. Lungs clear. Heart regular rate and rhythm.  Abdomen is soft. Bowel sounds are positive. No hepatomegaly. No abdominal masses felt. No tenderness.  No edema to lower extremities.            Assessment & Plan:  Family hx of colon cancer. Colonoscopy. The risks of bleeding, perforation and infection were reviewed with patient.

## 2017-05-16 NOTE — Patient Instructions (Signed)
The risks of bleeding, perforation and infection were reviewed with patient.  

## 2017-05-19 ENCOUNTER — Encounter (INDEPENDENT_AMBULATORY_CARE_PROVIDER_SITE_OTHER): Payer: Self-pay | Admitting: *Deleted

## 2017-05-19 ENCOUNTER — Telehealth (INDEPENDENT_AMBULATORY_CARE_PROVIDER_SITE_OTHER): Payer: Self-pay | Admitting: *Deleted

## 2017-05-19 ENCOUNTER — Other Ambulatory Visit (INDEPENDENT_AMBULATORY_CARE_PROVIDER_SITE_OTHER): Payer: Self-pay | Admitting: Internal Medicine

## 2017-05-19 DIAGNOSIS — Z800 Family history of malignant neoplasm of digestive organs: Secondary | ICD-10-CM | POA: Insufficient documentation

## 2017-05-19 MED ORDER — PEG 3350-KCL-NA BICARB-NACL 420 G PO SOLR: 4000.0000 mL | Freq: Once | ORAL | 0 refills | Status: AC

## 2017-05-19 NOTE — Telephone Encounter (Signed)
Patient needs trilyte 

## 2017-05-22 ENCOUNTER — Encounter (INDEPENDENT_AMBULATORY_CARE_PROVIDER_SITE_OTHER): Payer: Self-pay

## 2017-07-08 DIAGNOSIS — B80 Enterobiasis: Secondary | ICD-10-CM | POA: Diagnosis not present

## 2017-07-08 DIAGNOSIS — L309 Dermatitis, unspecified: Secondary | ICD-10-CM | POA: Diagnosis not present

## 2017-07-24 ENCOUNTER — Encounter (HOSPITAL_COMMUNITY): Payer: Self-pay

## 2017-07-24 ENCOUNTER — Ambulatory Visit (HOSPITAL_COMMUNITY): Admit: 2017-07-24 | Payer: Medicare HMO | Admitting: Internal Medicine

## 2017-07-24 SURGERY — COLONOSCOPY
Anesthesia: Moderate Sedation

## 2017-09-24 DIAGNOSIS — R197 Diarrhea, unspecified: Secondary | ICD-10-CM | POA: Diagnosis not present

## 2017-09-24 DIAGNOSIS — R140 Abdominal distension (gaseous): Secondary | ICD-10-CM | POA: Diagnosis not present

## 2017-11-18 DIAGNOSIS — R140 Abdominal distension (gaseous): Secondary | ICD-10-CM | POA: Diagnosis not present

## 2017-11-26 DIAGNOSIS — K909 Intestinal malabsorption, unspecified: Secondary | ICD-10-CM | POA: Diagnosis not present

## 2017-11-26 DIAGNOSIS — R197 Diarrhea, unspecified: Secondary | ICD-10-CM | POA: Diagnosis not present

## 2017-11-26 DIAGNOSIS — R140 Abdominal distension (gaseous): Secondary | ICD-10-CM | POA: Diagnosis not present

## 2017-12-09 DIAGNOSIS — D125 Benign neoplasm of sigmoid colon: Secondary | ICD-10-CM | POA: Diagnosis not present

## 2017-12-09 DIAGNOSIS — K5730 Diverticulosis of large intestine without perforation or abscess without bleeding: Secondary | ICD-10-CM | POA: Diagnosis not present

## 2017-12-09 DIAGNOSIS — Z1211 Encounter for screening for malignant neoplasm of colon: Secondary | ICD-10-CM | POA: Diagnosis not present

## 2017-12-09 DIAGNOSIS — D122 Benign neoplasm of ascending colon: Secondary | ICD-10-CM | POA: Diagnosis not present

## 2017-12-09 DIAGNOSIS — K635 Polyp of colon: Secondary | ICD-10-CM | POA: Diagnosis not present

## 2017-12-09 DIAGNOSIS — R197 Diarrhea, unspecified: Secondary | ICD-10-CM | POA: Diagnosis not present

## 2018-02-02 ENCOUNTER — Telehealth: Payer: Self-pay | Admitting: *Deleted

## 2018-02-02 NOTE — Telephone Encounter (Signed)
Patient called to follow up on her request to have the phrase of "psychotic about parasites" removed from her medical record.  RN reviewed Dr Algis Downs notes, found only ICD 10 diagnostic code [place] "Delusions of parasitosis" listed, not the phrase she is concerned about. RN repeated information for her documentation. Patient will review the chart, will call back if necessary. Landis Gandy, RN

## 2018-02-04 ENCOUNTER — Telehealth: Payer: Self-pay | Admitting: *Deleted

## 2018-02-04 NOTE — Telephone Encounter (Signed)
Jasmin Perez would like Dr Johnnye Sima to cancel the labs ordered 03/03/17, as they were not actually done.  She feels this is going to increase her likelihood of being seen by another provider.   She would like a call back from this nurse when/if these tests are cancelled. She would also like to know if that would change the visit diagnosis.  RN replied, "It is my understanding that Dr Johnnye Sima feels this is the correct diagnosis." Please advise if you are able to cancel the surgical pathology and ova and parasite exam. Landis Gandy, RN

## 2018-02-04 NOTE — Telephone Encounter (Signed)
I'm not sure how to cancel the lab I'll loop in Ms Nyoka Cowden thanks

## 2018-02-04 NOTE — Telephone Encounter (Signed)
You can cancel the labs by addending the office visit. Will be happy to help.

## 2018-02-04 NOTE — Telephone Encounter (Signed)
Thanks  Will do in clinic with you tomorrow!

## 2018-02-06 NOTE — Addendum Note (Signed)
Addended by: HATCHER, JEFFREY C on: 02/06/2018 10:11 AM   Modules accepted: Orders

## 2018-04-22 DIAGNOSIS — Z9049 Acquired absence of other specified parts of digestive tract: Secondary | ICD-10-CM | POA: Diagnosis not present

## 2018-07-04 IMAGING — CT CT ABD-PELV W/ CM
2 of 5 series · 16 of 46 positions shown, 18 images · IV contrast (iopamidol)
Comparison: None.

CLINICAL DATA: Right upper quadrant pain for 3 weeks.

EXAM:
CT ABDOMEN AND PELVIS WITH CONTRAST
TECHNIQUE: Multidetector CT imaging of the abdomen and pelvis was performed
using the standard protocol following bolus administration of
intravenous contrast.
CONTRAST:  100mL KRBKSS-8RR IOPAMIDOL (KRBKSS-8RR) INJECTION 61%

[Series 2: routine abd pel with · axial · 0.70mm/px · z∈[-418,-4]mm · 13 of 93 slices shown, 15 images]
[im 5/93  soft-tissue]
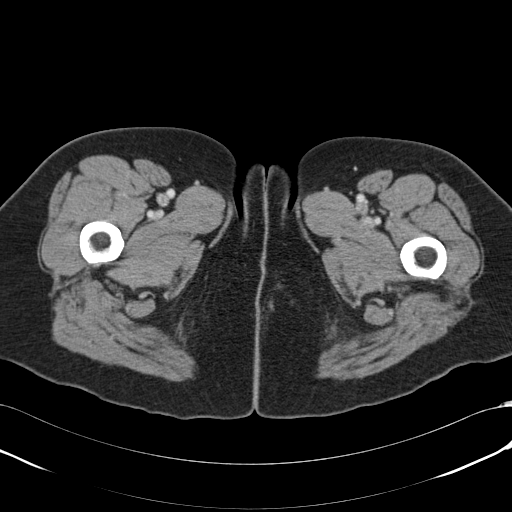
[im 5/93  bone]
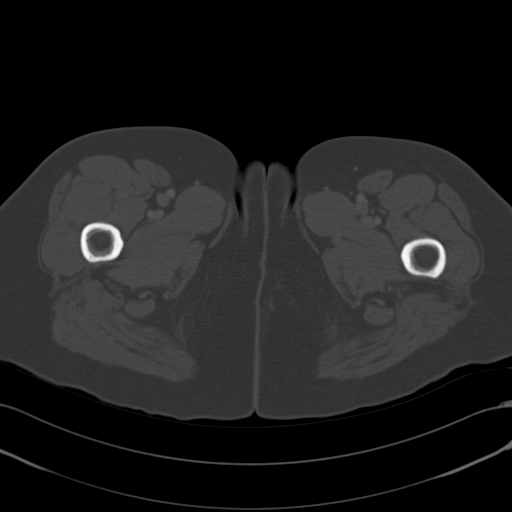
[im 15/93  soft-tissue]
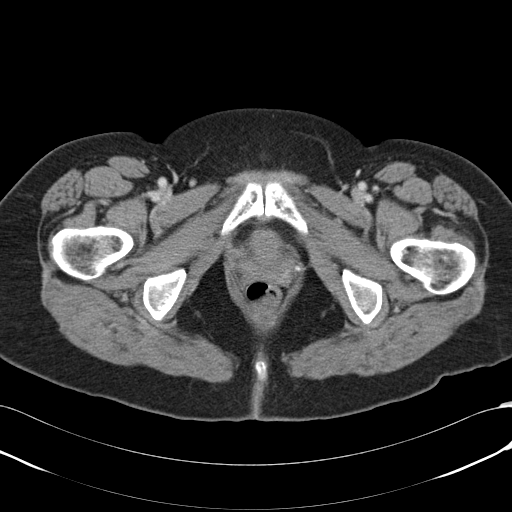
[im 20/93  soft-tissue]
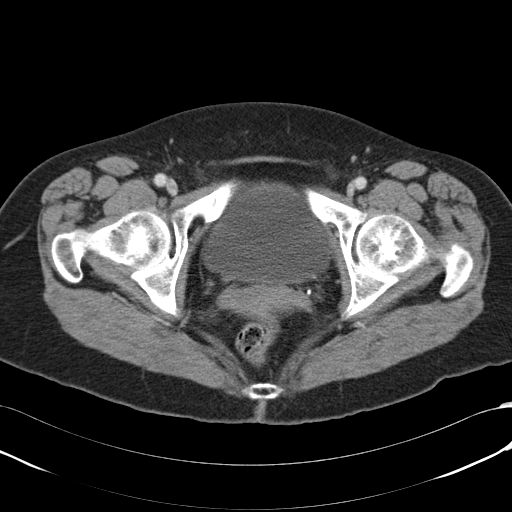
[im 25/93  soft-tissue]
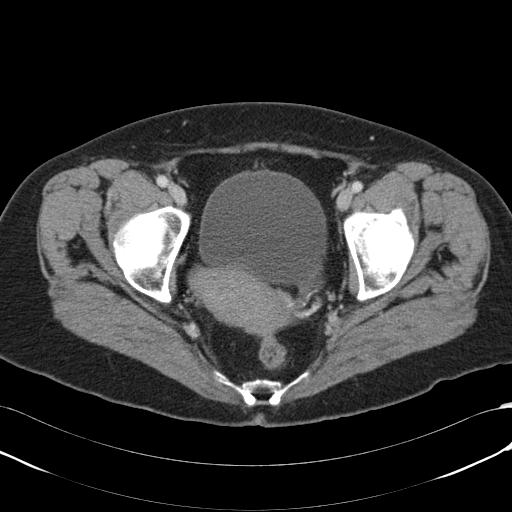
[im 34/93  soft-tissue]
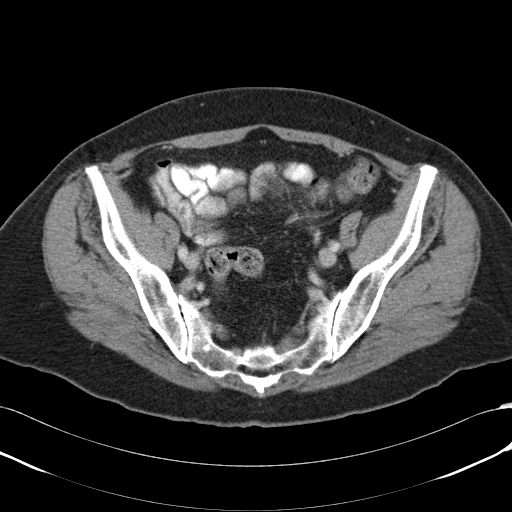
[im 39/93  soft-tissue]
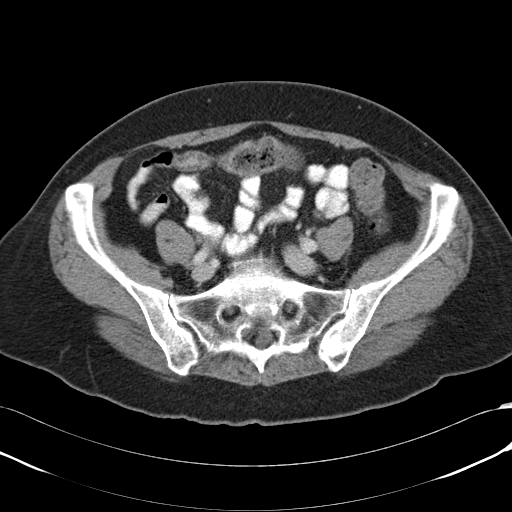
[im 49/93  soft-tissue]
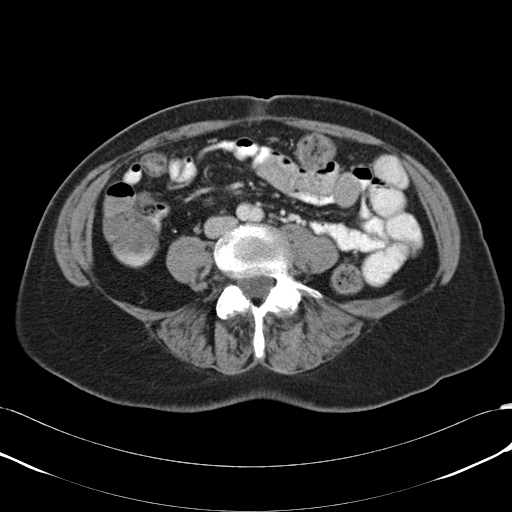
[im 54/93  soft-tissue]
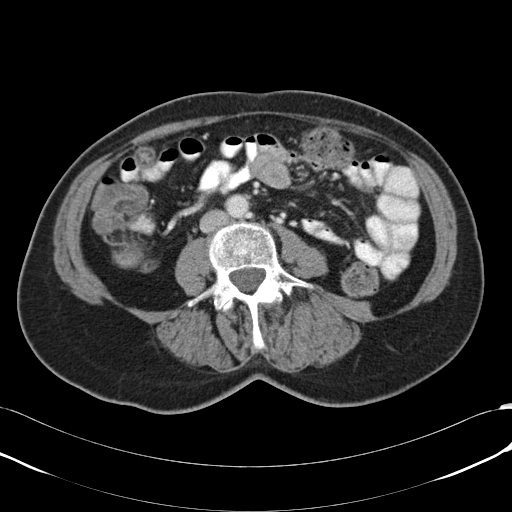
[im 59/93  soft-tissue]
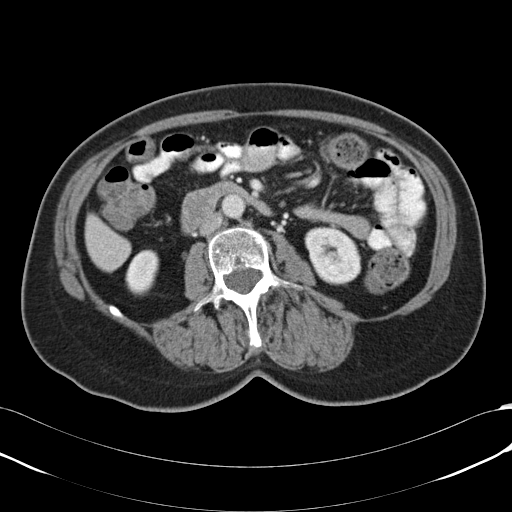
[im 59/93  bone]
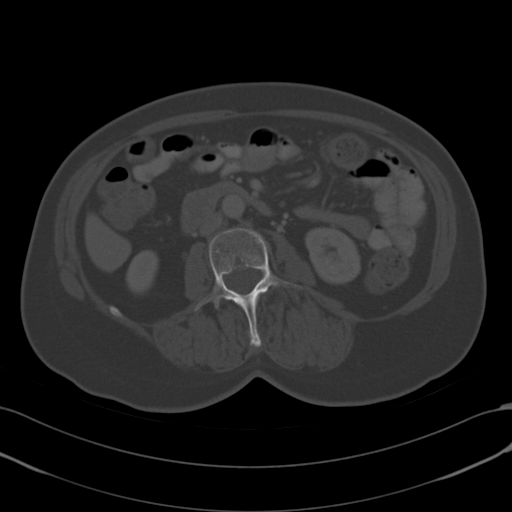
[im 68/93  soft-tissue]
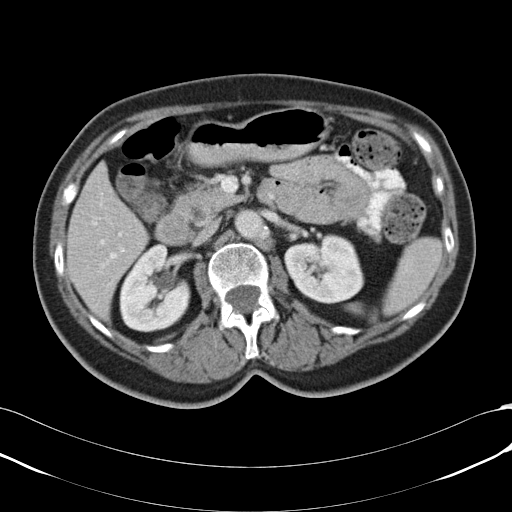
[im 73/93  soft-tissue]
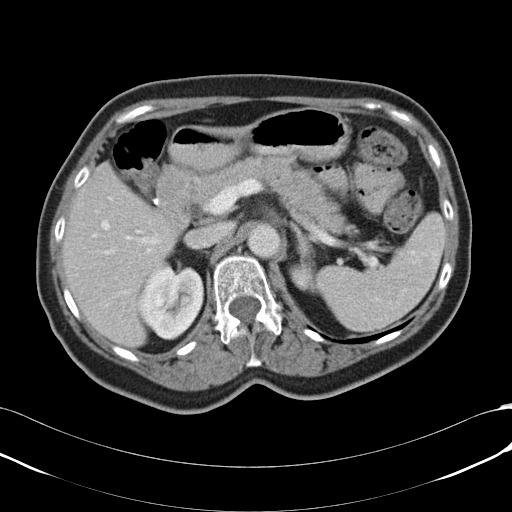
[im 78/93  soft-tissue]
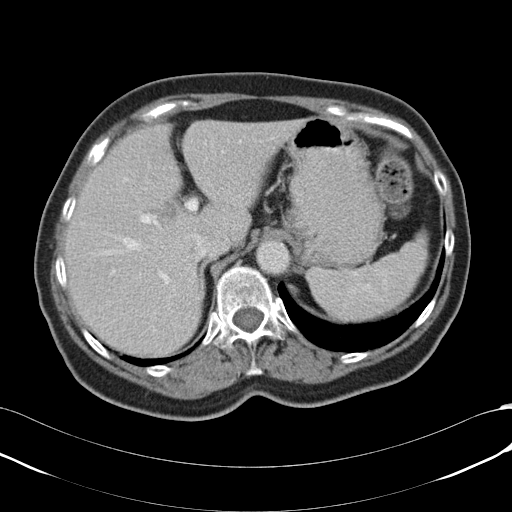
[im 88/93  soft-tissue]
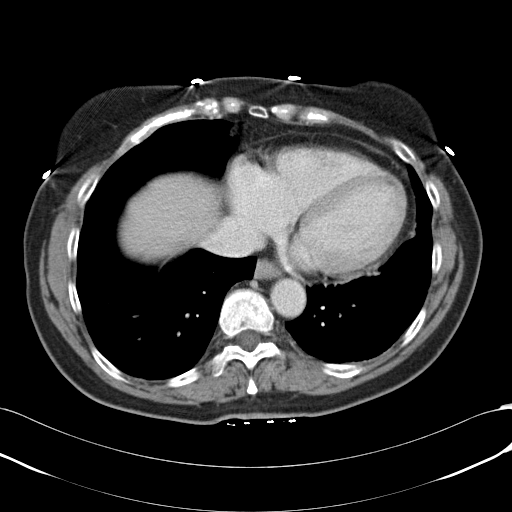

[Series 3: coronal · coronal · 0.73mm/px · 3 of 125 slices shown]
[im 42/125  soft-tissue]
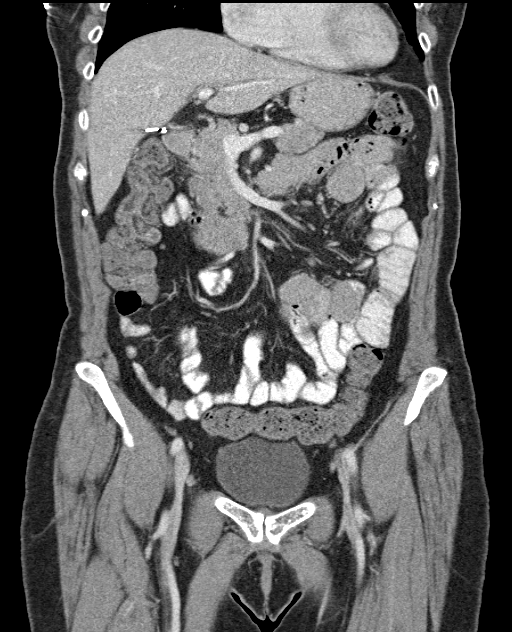
[im 56/125  soft-tissue]
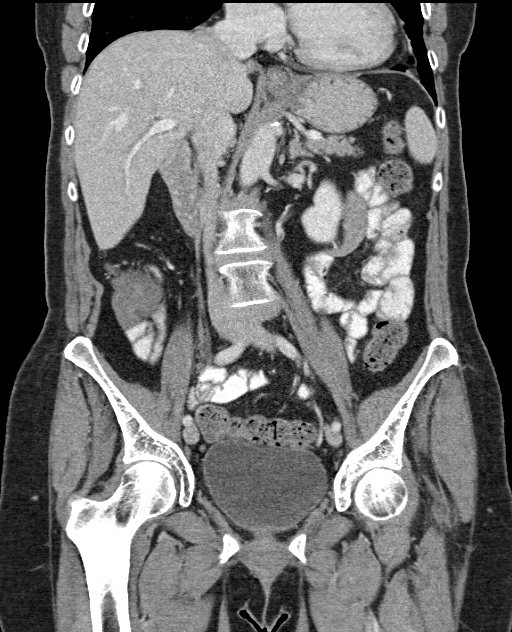
[im 69/125  soft-tissue]
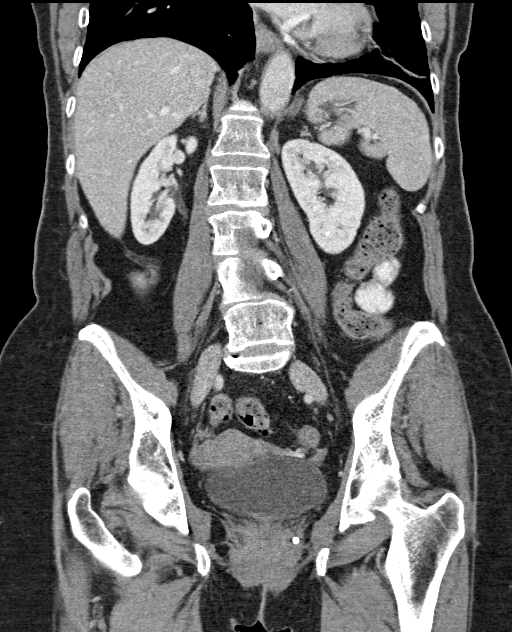

[16 of 46 positions shown; findings below may reference images not displayed]

FINDINGS: Lower chest: Bibasilar scar. Mild cardiomegaly, without pericardial
or pleural effusion.

Hepatobiliary: Too small to characterize right hepatic lobe lesion
on image 18/series 2 measures on the order of 6 mm.

Pancreas: Normal, without mass or ductal dilatation.

Spleen: Normal in size, without focal abnormality.

Adrenals/Urinary Tract: Normal adrenal glands. Normal kidneys,
without hydronephrosis. Normal urinary bladder.

Stomach/Bowel: Normal stomach, without wall thickening. Colonic
stool burden suggests constipation. A subtle small bowel
intussusception on image 28/series 2 and coronal image 47 is likely
transient, not identified on delayed images.

Vascular/Lymphatic: Aortic and branch vessel atherosclerosis. No
abdominopelvic adenopathy.

Reproductive: Normal uterus and adnexa.

Other: No significant free fluid.

Musculoskeletal: Likely degenerative or remote posttraumatic
sclerosis about the posterior left iliac. Degenerative disc disease
at the lumbosacral junction with trace L4-5 anterolisthesis. Disc
bulges at L4-5 and L5-S1. Mild convex right lumbar spine curvature
IMPRESSION: 1.  Possible constipation.
2. No other explanation for right-sided pain.
3.  Aortic atherosclerosis.

## 2018-08-04 DIAGNOSIS — Z9049 Acquired absence of other specified parts of digestive tract: Secondary | ICD-10-CM | POA: Diagnosis not present

## 2018-08-04 DIAGNOSIS — Z0189 Encounter for other specified special examinations: Secondary | ICD-10-CM | POA: Diagnosis not present

## 2018-08-04 DIAGNOSIS — E785 Hyperlipidemia, unspecified: Secondary | ICD-10-CM | POA: Diagnosis not present

## 2018-08-07 DIAGNOSIS — Z91018 Allergy to other foods: Secondary | ICD-10-CM | POA: Diagnosis not present

## 2018-08-07 DIAGNOSIS — Z9049 Acquired absence of other specified parts of digestive tract: Secondary | ICD-10-CM | POA: Diagnosis not present

## 2018-08-07 DIAGNOSIS — Z0000 Encounter for general adult medical examination without abnormal findings: Secondary | ICD-10-CM | POA: Diagnosis not present

## 2018-08-07 DIAGNOSIS — E782 Mixed hyperlipidemia: Secondary | ICD-10-CM | POA: Diagnosis not present

## 2018-08-07 DIAGNOSIS — Z0001 Encounter for general adult medical examination with abnormal findings: Secondary | ICD-10-CM | POA: Diagnosis not present

## 2018-08-07 DIAGNOSIS — Z8619 Personal history of other infectious and parasitic diseases: Secondary | ICD-10-CM | POA: Diagnosis not present

## 2018-08-12 DIAGNOSIS — Z8619 Personal history of other infectious and parasitic diseases: Secondary | ICD-10-CM | POA: Diagnosis not present

## 2018-08-12 DIAGNOSIS — Z0001 Encounter for general adult medical examination with abnormal findings: Secondary | ICD-10-CM | POA: Diagnosis not present

## 2018-08-12 DIAGNOSIS — Z9049 Acquired absence of other specified parts of digestive tract: Secondary | ICD-10-CM | POA: Diagnosis not present

## 2018-08-12 DIAGNOSIS — Z91018 Allergy to other foods: Secondary | ICD-10-CM | POA: Diagnosis not present

## 2018-08-12 DIAGNOSIS — E782 Mixed hyperlipidemia: Secondary | ICD-10-CM | POA: Diagnosis not present

## 2018-08-12 DIAGNOSIS — Z0189 Encounter for other specified special examinations: Secondary | ICD-10-CM | POA: Diagnosis not present

## 2018-08-12 DIAGNOSIS — Z0000 Encounter for general adult medical examination without abnormal findings: Secondary | ICD-10-CM | POA: Diagnosis not present

## 2018-08-20 DIAGNOSIS — Z8619 Personal history of other infectious and parasitic diseases: Secondary | ICD-10-CM | POA: Diagnosis not present

## 2018-08-20 DIAGNOSIS — E782 Mixed hyperlipidemia: Secondary | ICD-10-CM | POA: Diagnosis not present

## 2018-10-06 DIAGNOSIS — K921 Melena: Secondary | ICD-10-CM | POA: Diagnosis not present

## 2018-10-06 DIAGNOSIS — Z8619 Personal history of other infectious and parasitic diseases: Secondary | ICD-10-CM | POA: Diagnosis not present

## 2018-10-14 DIAGNOSIS — Z9049 Acquired absence of other specified parts of digestive tract: Secondary | ICD-10-CM | POA: Diagnosis not present

## 2018-10-14 DIAGNOSIS — Z91018 Allergy to other foods: Secondary | ICD-10-CM | POA: Diagnosis not present

## 2018-10-14 DIAGNOSIS — Z8619 Personal history of other infectious and parasitic diseases: Secondary | ICD-10-CM | POA: Diagnosis not present

## 2018-10-14 DIAGNOSIS — E782 Mixed hyperlipidemia: Secondary | ICD-10-CM | POA: Diagnosis not present

## 2018-10-14 DIAGNOSIS — Z0001 Encounter for general adult medical examination with abnormal findings: Secondary | ICD-10-CM | POA: Diagnosis not present

## 2018-10-14 DIAGNOSIS — Z0189 Encounter for other specified special examinations: Secondary | ICD-10-CM | POA: Diagnosis not present

## 2018-10-14 DIAGNOSIS — Z0000 Encounter for general adult medical examination without abnormal findings: Secondary | ICD-10-CM | POA: Diagnosis not present

## 2019-03-05 DIAGNOSIS — Z20822 Contact with and (suspected) exposure to covid-19: Secondary | ICD-10-CM | POA: Diagnosis not present

## 2019-03-05 DIAGNOSIS — H6590 Unspecified nonsuppurative otitis media, unspecified ear: Secondary | ICD-10-CM | POA: Diagnosis not present

## 2019-04-22 DIAGNOSIS — R69 Illness, unspecified: Secondary | ICD-10-CM | POA: Diagnosis not present

## 2019-11-02 DIAGNOSIS — Z91018 Allergy to other foods: Secondary | ICD-10-CM | POA: Diagnosis not present

## 2019-11-02 DIAGNOSIS — K921 Melena: Secondary | ICD-10-CM | POA: Diagnosis not present

## 2019-11-02 DIAGNOSIS — Z0001 Encounter for general adult medical examination with abnormal findings: Secondary | ICD-10-CM | POA: Diagnosis not present

## 2019-11-02 DIAGNOSIS — Z0189 Encounter for other specified special examinations: Secondary | ICD-10-CM | POA: Diagnosis not present

## 2019-11-02 DIAGNOSIS — Z9049 Acquired absence of other specified parts of digestive tract: Secondary | ICD-10-CM | POA: Diagnosis not present

## 2019-11-02 DIAGNOSIS — E782 Mixed hyperlipidemia: Secondary | ICD-10-CM | POA: Diagnosis not present

## 2019-11-02 DIAGNOSIS — Z0000 Encounter for general adult medical examination without abnormal findings: Secondary | ICD-10-CM | POA: Diagnosis not present

## 2019-11-02 DIAGNOSIS — Z8619 Personal history of other infectious and parasitic diseases: Secondary | ICD-10-CM | POA: Diagnosis not present

## 2020-06-05 ENCOUNTER — Encounter (HOSPITAL_COMMUNITY): Payer: Self-pay

## 2020-06-05 ENCOUNTER — Emergency Department (HOSPITAL_COMMUNITY): Payer: Medicare HMO

## 2020-06-05 ENCOUNTER — Emergency Department (HOSPITAL_COMMUNITY)
Admission: EM | Admit: 2020-06-05 | Discharge: 2020-06-05 | Disposition: A | Discharge status: Home / Self Care | Payer: Medicare HMO | Attending: Emergency Medicine | Admitting: Emergency Medicine

## 2020-06-05 ENCOUNTER — Other Ambulatory Visit: Payer: Self-pay

## 2020-06-05 DIAGNOSIS — Z9049 Acquired absence of other specified parts of digestive tract: Secondary | ICD-10-CM | POA: Diagnosis not present

## 2020-06-05 DIAGNOSIS — R1031 Right lower quadrant pain: Secondary | ICD-10-CM | POA: Diagnosis not present

## 2020-06-05 DIAGNOSIS — N200 Calculus of kidney: Secondary | ICD-10-CM | POA: Diagnosis not present

## 2020-06-05 DIAGNOSIS — R1033 Periumbilical pain: Secondary | ICD-10-CM | POA: Diagnosis not present

## 2020-06-05 DIAGNOSIS — M4186 Other forms of scoliosis, lumbar region: Secondary | ICD-10-CM | POA: Diagnosis not present

## 2020-06-05 DIAGNOSIS — Z91010 Allergy to peanuts: Secondary | ICD-10-CM | POA: Diagnosis not present

## 2020-06-05 LAB — URINALYSIS, ROUTINE W REFLEX MICROSCOPIC
Bilirubin Urine: NEGATIVE
Glucose, UA: NEGATIVE mg/dL
Hgb urine dipstick: NEGATIVE
Ketones, ur: NEGATIVE mg/dL
Leukocytes,Ua: NEGATIVE
Nitrite: NEGATIVE
Protein, ur: NEGATIVE mg/dL
Specific Gravity, Urine: 1.015 (ref 1.005–1.030)
pH: 5 (ref 5.0–8.0)

## 2020-06-05 LAB — COMPREHENSIVE METABOLIC PANEL
ALT: 19 U/L (ref 0–44)
AST: 19 U/L (ref 15–41)
Albumin: 4.2 g/dL (ref 3.5–5.0)
Alkaline Phosphatase: 83 U/L (ref 38–126)
Anion gap: 6 (ref 5–15)
BUN: 18 mg/dL (ref 8–23)
CO2: 28 mmol/L (ref 22–32)
Calcium: 9.6 mg/dL (ref 8.9–10.3)
Chloride: 104 mmol/L (ref 98–111)
Creatinine, Ser: 0.66 mg/dL (ref 0.44–1.00)
GFR, Estimated: >60 mL/min (ref >60)
Glucose, Bld: 101 mg/dL — ABNORMAL HIGH (ref 70–99)
Potassium: 4 mmol/L (ref 3.5–5.1)
Sodium: 138 mmol/L (ref 135–145)
Total Bilirubin: 0.5 mg/dL (ref 0.3–1.2)
Total Protein: 6.8 g/dL (ref 6.5–8.1)

## 2020-06-05 LAB — CBC
HCT: 44 % (ref 36.0–46.0)
Hemoglobin: 14.2 g/dL (ref 12.0–15.0)
MCH: 29.6 pg (ref 26.0–34.0)
MCHC: 32.3 g/dL (ref 30.0–36.0)
MCV: 91.7 fL (ref 80.0–100.0)
Platelets: 244 10*3/uL (ref 150–400)
RBC: 4.8 MIL/uL (ref 3.87–5.11)
RDW: 13.6 % (ref 11.5–15.5)
WBC: 5.1 10*3/uL (ref 4.0–10.5)
nRBC: 0 % (ref 0.0–0.2)

## 2020-06-05 LAB — LIPASE, BLOOD: Lipase: 29 U/L (ref 11–51)

## 2020-06-05 NOTE — ED Provider Notes (Signed)
[facility] EMERGENCY DEPARTMENT Provider Note   CSN: [id number] Arrival date & time: 06/05/20  0915     History Chief Complaint  Patient presents with  . Abdominal Pain    Jasmin Perez is a 70 y.o. female with no relevant past medical history presents the ED with complaints of right lower quadrant abdominal pain.  On my examination, patient reports that she woke up at 3 AM this morning to a burning 8 out of 10 pain involving her right lower quadrant.  She states that it is intermittent, waxing and waning.  Denies any associated fevers, nausea, vomiting, diarrhea, or other changes in her bowel habits.  She also denies any chest pain, shortness of breath, or any other symptoms.  She does not take medications regularly aside from vitamins and supplements.  Denies any history of abdominal surgeries.  She is still passing gas, last BM was yesterday, normal.  Soft brown.  She states that her sister had a ruptured appendix and that was her primary concern here today.  She declines any treatment with analgesics or antiemetics at this time.  She has an allergy to contrast dye.  HPI     Past Medical History:  Diagnosis Date  . Past history of nut allergy     Patient Active Problem List   Diagnosis Date Noted  . Family hx of colon cancer 05/19/2017    Past Surgical History:  Procedure Laterality Date  . CHOLECYSTECTOMY    . WRIST FRACTURE SURGERY Right      OB History   No obstetric history on file.     Family History  Problem Relation Age of Onset  . COPD Father   . Cancer - Prostate Brother   . Diabetes Brother     Social History   Tobacco Use  . Smoking status: Never Smoker  . Smokeless tobacco: Never Used  Substance Use Topics  . Alcohol use: No  . Drug use: No    Home Medications Prior to Admission medications   Medication Sig Start Date End Date Taking? Authorizing Provider  calcium-vitamin D (OSCAL WITH D) 500-200 MG-UNIT tablet Take 1 tablet by  mouth daily.    [provider]  Cholecalciferol (VITAMIN D PO) Take 1 tablet by mouth daily.    [provider]  magnesium oxide (MAG-OX) 400 MG tablet Take 400 mg by mouth daily.    [provider]  Multiple Vitamins-Minerals (MULTIVITAMINS THER. W/MINERALS) TABS tablet Take 1 tablet by mouth daily.    [provider]  Omega-3 Fatty Acids (FISH OIL) 1000 MG CAPS Take by mouth.    [provider]  vitamin C (ASCORBIC ACID) 500 MG tablet Take by mouth daily.    [provider]  VITAMIN E PO Take 1 tablet by mouth daily.    [provider]  Zinc 100 MG TABS Take by mouth.    [provider]    Allergies    Peanut-containing drug products  Review of Systems   Review of Systems  All other systems reviewed and are negative.   Physical Exam Updated Vital Signs BP 130/64 (BP Location: Right Arm)   Pulse 63   Temp 98.3 F (36.8 C) (Oral)   Resp 16   Ht 5\' 5"  (1.651 m)   Wt 68 kg   SpO2 100%   BMI 24.96 kg/m   Physical Exam Vitals and nursing note reviewed. Exam conducted with a chaperone present.  Constitutional:  General: She is not in acute distress. HENT:     Head: Normocephalic and atraumatic.  Eyes:     General: No scleral icterus.    Conjunctiva/sclera: Conjunctivae normal.  Cardiovascular:     Rate and Rhythm: Normal rate and regular rhythm.     Pulses: Normal pulses.  Pulmonary:     Effort: Pulmonary effort is normal. No respiratory distress.  Abdominal:     General: Abdomen is flat. There is no distension.     Palpations: Abdomen is soft.     Tenderness: There is abdominal tenderness. There is no right CVA tenderness, left CVA tenderness or guarding.     Comments: TTP most notably in right lower quadrant over McBurney's point.  TTP also in periumbilical and suprapubic region.  No significant tenderness elsewhere.  Negative McBurney's point tenderness.  No guarding.  No overlying skin  changes.  Musculoskeletal:     Cervical back: Normal range of motion.  Skin:    General: Skin is dry.     Findings: No rash.     Comments: No rash, vesicular or otherwise.  Neurological:     Mental Status: She is alert and oriented to person, place, and time.     GCS: GCS eye subscore is 4. GCS verbal subscore is 5. GCS motor subscore is 6.  Psychiatric:        Mood and Affect: Mood normal.        Behavior: Behavior normal.        Thought Content: Thought content normal.     ED Results / Procedures / Treatments   Labs (all labs ordered are listed, but only abnormal results are displayed) Labs Reviewed  COMPREHENSIVE METABOLIC PANEL - Abnormal; Notable for the following components:      Result Value   Glucose, Bld 101 (*)    All other components within normal limits  LIPASE, BLOOD  CBC  URINALYSIS, ROUTINE W REFLEX MICROSCOPIC    EKG None  Radiology CT ABDOMEN PELVIS WO CONTRAST  Result Date: 06/05/2020 CLINICAL DATA:  Onset right lower quadrant pain this morning. EXAM: CT ABDOMEN AND PELVIS WITHOUT CONTRAST TECHNIQUE: Multidetector CT imaging of the abdomen and pelvis was performed following the standard protocol without IV contrast. COMPARISON:  CT abdomen and pelvis 08/08/2015. FINDINGS: Lower chest: Minimal dependent atelectasis in the lung bases. No pleural or pericardial effusion. Hepatobiliary: No focal liver abnormality is seen. Status post cholecystectomy. No biliary dilatation. Pancreas: Unremarkable. No pancreatic ductal dilatation or surrounding inflammatory changes. Spleen: Normal in size without focal abnormality. Adrenals/Urinary Tract: Adrenal glands appear normal. There is a punctate nonobstructing stone in the upper pole of the right kidney. No other urinary tract stones are identified. Negative for hydronephrosis. Ureters and urinary bladder are negative. Stomach/Bowel: Stomach is within normal limits. Appendix appears normal. No evidence of bowel wall  thickening, distention, or inflammatory changes. Vascular/Lymphatic: No significant vascular findings are present. No enlarged abdominal or pelvic lymph nodes. Reproductive: Uterus and bilateral adnexa are unremarkable. Other: None. Musculoskeletal: No acute or focal abnormality. Mild convex right lumbar scoliosis and degenerative change noted. IMPRESSION: No acute abnormality abdomen or pelvis. No finding to explain the patient's symptoms. Punctate nonobstructing stone upper pole right kidney. Electronically Signed   By: Inge Rise M.D.   On: 06/05/2020 12:20    Procedures Procedures   Medications Ordered in ED Medications - No data to display  ED Course  I have reviewed the triage vital signs and the nursing notes.  Pertinent labs &  imaging results that were available during my care of the patient were reviewed by me and considered in my medical decision making (see chart for details).    MDM Rules/Calculators/A&P                          Jasmin Perez was evaluated in Emergency Department on 06/05/2020 for the symptoms described in the history of present illness. She was evaluated in the context of the global COVID-19 pandemic, which necessitated consideration that the patient might be at risk for infection with the SARS-CoV-2 virus that causes COVID-19. Institutional protocols and algorithms that pertain to the evaluation of patients at risk for COVID-19 are in a state of rapid change based on information released by regulatory bodies including the CDC and federal and state organizations. These policies and algorithms were followed during the patient's care in the ED.  I personally reviewed patient's medical chart and all notes from triage and staff during today's encounter. I have also ordered and reviewed all labs and imaging that I felt to be medically necessary in the evaluation of this patient's complaints and with consideration of their physical exam. If needed, translation  services were available and utilized.   Patient with sudden onset right lower quadrant abdominal pain that woke her from her sleep at 3 AM.  Described as "burning".  Denies any radiation to flank.  No associated symptoms.  Denies any recent illness or infection, fevers or chills, or other concerning history.  She is still passing gas.  She is concerned for appendicitis.  Appendicitis and obstructing ureterolithiasis are highest on my differential at this time.  We will proceed with CT abdomen and pelvis without contrast due to national shortage and her reported contrast dye allergy.  We will also proceed with laboratory work-up.  She is denying any pain control or antiemetics at this time.  Laboratory work-up is reviewed and entirely unremarkable.  No leukocytosis concerning for infection.  No electrolyte derangement.  No hepatic or renal impairment.  Lipase is within normal limits.  UA without evidence of infection or hematuria.  CT is personally reviewed and demonstrates no acute intra-abdominal or intrapelvic abnormalities to explain patient's pain symptoms.  There is no overlying rash suggestive of herpes zoster.  She states that her pain symptoms are mild.  She also states that she is in the process of downsizing her home and has been lifting boxes recently.  Possible musculoskeletal.  Will encourage over-the-counter pain medications as needed.  This patient presents with abdominal pain of unclear etiology. A CT scan was performed to evaluate for potential causes of the abdominal pain, however, neither the clinical exam nor the CT has identified an emergent etiology for the abdominal pain. Specifically, given the benign exam, the laboratory studies, and unremarkable CT, I have a very low suspicion for appendicitis, ischemic bowel, bowel perforation, or any other life threatening disease. I have discussed with the patient the level of uncertainty with undifferentiated abdominal pain and clearly explained  the need to follow-up as noted on the discharge instructions, or return to the Emergency Department immediately if the pain worsens, develops fever, persistent and uncontrollable vomiting, or for any new symptoms or concerns.  Final Clinical Impression(s) / ED Diagnoses Final diagnoses:  RLQ abdominal pain    Rx / DC Orders ED Discharge Orders    None       Corena Herter, PA-C 06/05/20 1434    Fredia Sorrow, MD  06/10/20 0101  

## 2020-06-05 NOTE — ED Triage Notes (Signed)
Pt presents to ED for right lower quadrant abdominal pain since 0300. Pt denies N/V/D or fever. Last BM last night

## 2020-06-05 NOTE — Discharge Instructions (Signed)
Your comprehensive work-up including lab testing and CT imaging was entirely unremarkable and without any explanation for your pain symptoms.  Given that you are in the process of downsizing homes, suspect possibly musculoskeletal.  I recommend over-the-counter medications as needed.  You may also try topical gels to see if that helps.  While your work-up was reassuring, I cannot emphasize enough the importance of repeat abdominal exams.  If your pain symptoms worsen, you develop intractable nausea and vomiting, fevers, or bloody stools, you will need to return to the ED.  Please follow-up with your primary care provider very closely regarding today's ED encounter.

## 2020-07-14 DIAGNOSIS — Z20822 Contact with and (suspected) exposure to covid-19: Secondary | ICD-10-CM | POA: Diagnosis not present

## 2020-09-12 DIAGNOSIS — Z0001 Encounter for general adult medical examination with abnormal findings: Secondary | ICD-10-CM | POA: Diagnosis not present

## 2020-09-12 DIAGNOSIS — Z6826 Body mass index (BMI) 26.0-26.9, adult: Secondary | ICD-10-CM | POA: Diagnosis not present

## 2020-09-12 DIAGNOSIS — M810 Age-related osteoporosis without current pathological fracture: Secondary | ICD-10-CM | POA: Diagnosis not present

## 2020-09-20 DIAGNOSIS — Z1321 Encounter for screening for nutritional disorder: Secondary | ICD-10-CM | POA: Diagnosis not present

## 2020-09-20 DIAGNOSIS — Z13220 Encounter for screening for lipoid disorders: Secondary | ICD-10-CM | POA: Diagnosis not present

## 2020-09-20 DIAGNOSIS — E785 Hyperlipidemia, unspecified: Secondary | ICD-10-CM | POA: Diagnosis not present

## 2020-09-20 DIAGNOSIS — Z0001 Encounter for general adult medical examination with abnormal findings: Secondary | ICD-10-CM | POA: Diagnosis not present

## 2020-09-20 DIAGNOSIS — Z1329 Encounter for screening for other suspected endocrine disorder: Secondary | ICD-10-CM | POA: Diagnosis not present

## 2020-09-20 DIAGNOSIS — Z131 Encounter for screening for diabetes mellitus: Secondary | ICD-10-CM | POA: Diagnosis not present

## 2020-09-20 DIAGNOSIS — Z1159 Encounter for screening for other viral diseases: Secondary | ICD-10-CM | POA: Diagnosis not present

## 2020-09-26 DIAGNOSIS — Z1331 Encounter for screening for depression: Secondary | ICD-10-CM | POA: Diagnosis not present

## 2020-09-26 DIAGNOSIS — E785 Hyperlipidemia, unspecified: Secondary | ICD-10-CM | POA: Diagnosis not present

## 2020-09-26 DIAGNOSIS — R7303 Prediabetes: Secondary | ICD-10-CM | POA: Diagnosis not present

## 2020-09-26 DIAGNOSIS — Z6826 Body mass index (BMI) 26.0-26.9, adult: Secondary | ICD-10-CM | POA: Diagnosis not present

## 2020-09-26 DIAGNOSIS — Z1389 Encounter for screening for other disorder: Secondary | ICD-10-CM | POA: Diagnosis not present

## 2020-09-26 DIAGNOSIS — Z23 Encounter for immunization: Secondary | ICD-10-CM | POA: Diagnosis not present

## 2020-09-26 DIAGNOSIS — M810 Age-related osteoporosis without current pathological fracture: Secondary | ICD-10-CM | POA: Diagnosis not present

## 2020-10-31 DIAGNOSIS — H524 Presbyopia: Secondary | ICD-10-CM | POA: Diagnosis not present

## 2020-10-31 DIAGNOSIS — H52203 Unspecified astigmatism, bilateral: Secondary | ICD-10-CM | POA: Diagnosis not present

## 2020-10-31 DIAGNOSIS — H25813 Combined forms of age-related cataract, bilateral: Secondary | ICD-10-CM | POA: Diagnosis not present

## 2020-11-27 DIAGNOSIS — B353 Tinea pedis: Secondary | ICD-10-CM | POA: Diagnosis not present

## 2020-11-27 DIAGNOSIS — Z1283 Encounter for screening for malignant neoplasm of skin: Secondary | ICD-10-CM | POA: Diagnosis not present

## 2020-11-27 DIAGNOSIS — L570 Actinic keratosis: Secondary | ICD-10-CM | POA: Diagnosis not present

## 2020-12-25 DIAGNOSIS — Z1231 Encounter for screening mammogram for malignant neoplasm of breast: Secondary | ICD-10-CM | POA: Diagnosis not present

## 2021-09-19 DIAGNOSIS — R030 Elevated blood-pressure reading, without diagnosis of hypertension: Secondary | ICD-10-CM | POA: Diagnosis not present

## 2021-09-19 DIAGNOSIS — Z1159 Encounter for screening for other viral diseases: Secondary | ICD-10-CM | POA: Diagnosis not present

## 2021-09-19 DIAGNOSIS — Z6823 Body mass index (BMI) 23.0-23.9, adult: Secondary | ICD-10-CM | POA: Diagnosis not present

## 2021-09-19 DIAGNOSIS — E785 Hyperlipidemia, unspecified: Secondary | ICD-10-CM | POA: Diagnosis not present

## 2021-09-19 DIAGNOSIS — Z0000 Encounter for general adult medical examination without abnormal findings: Secondary | ICD-10-CM | POA: Diagnosis not present

## 2021-09-19 DIAGNOSIS — Z1321 Encounter for screening for nutritional disorder: Secondary | ICD-10-CM | POA: Diagnosis not present

## 2021-09-19 DIAGNOSIS — Z1329 Encounter for screening for other suspected endocrine disorder: Secondary | ICD-10-CM | POA: Diagnosis not present

## 2021-09-19 DIAGNOSIS — M810 Age-related osteoporosis without current pathological fracture: Secondary | ICD-10-CM | POA: Diagnosis not present

## 2021-09-19 DIAGNOSIS — R7303 Prediabetes: Secondary | ICD-10-CM | POA: Diagnosis not present

## 2021-09-27 DIAGNOSIS — M810 Age-related osteoporosis without current pathological fracture: Secondary | ICD-10-CM | POA: Diagnosis not present

## 2021-09-27 DIAGNOSIS — Z6823 Body mass index (BMI) 23.0-23.9, adult: Secondary | ICD-10-CM | POA: Diagnosis not present

## 2021-09-27 DIAGNOSIS — R7303 Prediabetes: Secondary | ICD-10-CM | POA: Diagnosis not present

## 2021-09-27 DIAGNOSIS — E785 Hyperlipidemia, unspecified: Secondary | ICD-10-CM | POA: Diagnosis not present

## 2021-09-27 DIAGNOSIS — R030 Elevated blood-pressure reading, without diagnosis of hypertension: Secondary | ICD-10-CM | POA: Diagnosis not present

## 2021-09-27 DIAGNOSIS — Z0000 Encounter for general adult medical examination without abnormal findings: Secondary | ICD-10-CM | POA: Diagnosis not present

## 2021-11-05 DIAGNOSIS — H52203 Unspecified astigmatism, bilateral: Secondary | ICD-10-CM | POA: Diagnosis not present

## 2021-11-05 DIAGNOSIS — H25813 Combined forms of age-related cataract, bilateral: Secondary | ICD-10-CM | POA: Diagnosis not present

## 2021-11-05 DIAGNOSIS — H524 Presbyopia: Secondary | ICD-10-CM | POA: Diagnosis not present

## 2021-11-26 DIAGNOSIS — Z1283 Encounter for screening for malignant neoplasm of skin: Secondary | ICD-10-CM | POA: Diagnosis not present

## 2021-11-26 DIAGNOSIS — D239 Other benign neoplasm of skin, unspecified: Secondary | ICD-10-CM | POA: Diagnosis not present

## 2021-11-26 DIAGNOSIS — B009 Herpesviral infection, unspecified: Secondary | ICD-10-CM | POA: Diagnosis not present

## 2022-05-01 DIAGNOSIS — Z1211 Encounter for screening for malignant neoplasm of colon: Secondary | ICD-10-CM | POA: Diagnosis not present

## 2022-05-01 DIAGNOSIS — Z1212 Encounter for screening for malignant neoplasm of rectum: Secondary | ICD-10-CM | POA: Diagnosis not present

## 2022-09-25 DIAGNOSIS — Z0001 Encounter for general adult medical examination with abnormal findings: Secondary | ICD-10-CM | POA: Diagnosis not present

## 2022-09-25 DIAGNOSIS — M810 Age-related osteoporosis without current pathological fracture: Secondary | ICD-10-CM | POA: Diagnosis not present

## 2022-09-25 DIAGNOSIS — Z6825 Body mass index (BMI) 25.0-25.9, adult: Secondary | ICD-10-CM | POA: Diagnosis not present

## 2022-09-25 DIAGNOSIS — R7303 Prediabetes: Secondary | ICD-10-CM | POA: Diagnosis not present

## 2022-09-25 DIAGNOSIS — E785 Hyperlipidemia, unspecified: Secondary | ICD-10-CM | POA: Diagnosis not present

## 2022-09-25 DIAGNOSIS — R030 Elevated blood-pressure reading, without diagnosis of hypertension: Secondary | ICD-10-CM | POA: Diagnosis not present

## 2022-10-02 DIAGNOSIS — Z1321 Encounter for screening for nutritional disorder: Secondary | ICD-10-CM | POA: Diagnosis not present

## 2022-10-02 DIAGNOSIS — R7303 Prediabetes: Secondary | ICD-10-CM | POA: Diagnosis not present

## 2022-10-02 DIAGNOSIS — E785 Hyperlipidemia, unspecified: Secondary | ICD-10-CM | POA: Diagnosis not present

## 2022-10-02 DIAGNOSIS — Z1329 Encounter for screening for other suspected endocrine disorder: Secondary | ICD-10-CM | POA: Diagnosis not present

## 2022-10-08 DIAGNOSIS — H5203 Hypermetropia, bilateral: Secondary | ICD-10-CM | POA: Diagnosis not present

## 2022-10-08 DIAGNOSIS — H524 Presbyopia: Secondary | ICD-10-CM | POA: Diagnosis not present

## 2022-10-08 DIAGNOSIS — H52223 Regular astigmatism, bilateral: Secondary | ICD-10-CM | POA: Diagnosis not present

## 2022-10-15 ENCOUNTER — Encounter: Payer: Self-pay | Admitting: *Deleted

## 2022-11-14 ENCOUNTER — Telehealth: Payer: Self-pay | Admitting: *Deleted

## 2022-11-14 NOTE — Telephone Encounter (Signed)
  Procedure: Colonoscopy  Height: 5'4 Weight: 150lbs        Have you had a colonoscopy before?  2019 at [facility] (in care everywhere)  Do you have family history of colon cancer?  Yes nephew  Do you have a family history of polyps?   Previous colonoscopy with polyps removed? yes  Do you have a history colorectal cancer?   no  Are you diabetic?  no  Do you have a prosthetic or mechanical heart valve? no  Do you have a pacemaker/defibrillator?   no  Have you had endocarditis/atrial fibrillation?  no  Do you use supplemental oxygen/CPAP?  no  Have you had joint replacement within the last 12 months?  no  Do you tend to be constipated or have to use laxatives?  yes   Do you have history of alcohol use? If yes, how much and how often.  no  Do you have history or are you using drugs? If yes, what do are you  using?  no  Have you ever had a stroke/heart attack?  no  Have you ever had a heart or other vascular stent placed,?no  Do you take weight loss medication? no  female patients,: have you had a hysterectomy? no                              are you post menopausal?  yes                              do you still have your menstrual cycle? no    Date of last menstrual period?   Do you take any blood-thinning medications such as: (Plavix, aspirin, Coumadin, Aggrenox, Brilinta, Xarelto, Eliquis, Pradaxa, Savaysa or Effient)? no  If yes we need the name, milligram, dosage and who is prescribing doctor:               Current Outpatient Medications  Medication Sig Dispense Refill   Multiple Vitamin (MULTIVITAMIN) tablet Take 1 tablet by mouth daily.     No current facility-administered medications for this visit.    Allergies  Allergen Reactions   Peanut-Containing Drug Products

## 2022-11-14 NOTE — Telephone Encounter (Deleted)
Due to marking yes below, will need OV prior.

## 2022-11-18 DIAGNOSIS — B009 Herpesviral infection, unspecified: Secondary | ICD-10-CM | POA: Diagnosis not present

## 2022-11-18 DIAGNOSIS — L570 Actinic keratosis: Secondary | ICD-10-CM | POA: Diagnosis not present

## 2022-11-18 DIAGNOSIS — D485 Neoplasm of uncertain behavior of skin: Secondary | ICD-10-CM | POA: Diagnosis not present

## 2022-12-24 NOTE — Telephone Encounter (Signed)
ASA 2  -okay to schedule.   Needs Trilyte Take miralax daily for 4 days prior.

## 2022-12-25 NOTE — Telephone Encounter (Signed)
LMOVM to call back to schedule 

## 2022-12-26 ENCOUNTER — Encounter: Payer: Self-pay | Admitting: *Deleted

## 2022-12-26 ENCOUNTER — Encounter (INDEPENDENT_AMBULATORY_CARE_PROVIDER_SITE_OTHER): Payer: Self-pay | Admitting: *Deleted

## 2022-12-26 ENCOUNTER — Other Ambulatory Visit: Payer: Self-pay | Admitting: *Deleted

## 2022-12-26 MED ORDER — PEG 3350-KCL-NA BICARB-NACL 420 G PO SOLR: 4000.0000 mL | Freq: Once | ORAL | 0 refills | Status: AC

## 2022-12-26 NOTE — Telephone Encounter (Signed)
Pt has been scheduled for 01/27/23 with Dr.rourk. instructions mailed and prep sent to the pharmacy

## 2022-12-26 NOTE — Telephone Encounter (Signed)
Pt left vm returning call  LMTRC 

## 2022-12-26 NOTE — Telephone Encounter (Signed)
Darl Pikes, this is Proficient referral Referral completed, T[facility] apt letter sent to PCP

## 2023-01-09 ENCOUNTER — Encounter: Payer: Self-pay | Admitting: *Deleted

## 2023-01-09 NOTE — Telephone Encounter (Signed)
Pt left vm stating she will be out of town on her procedure date 01/27/23 and needs to reschedule.   LMTRC

## 2023-01-09 NOTE — Telephone Encounter (Signed)
Pt called to reschedule her procedure from 01/27/23 until 01/30/23 at 11:15 am. Updated instructions mailed

## 2023-01-27 ENCOUNTER — Telehealth: Payer: Self-pay | Admitting: *Deleted

## 2023-01-27 NOTE — Telephone Encounter (Signed)
 Pt called in to cancel procedure for Thursday with Dr. Jena Gauss. Message sent to endo

## 2023-01-30 ENCOUNTER — Ambulatory Visit (HOSPITAL_COMMUNITY)
Admission: RE | Admit: 2023-01-30 | Payer: No Typology Code available for payment source | Source: Home / Self Care | Admitting: Internal Medicine

## 2023-01-30 ENCOUNTER — Encounter (HOSPITAL_COMMUNITY): Admission: RE | Payer: Self-pay | Source: Home / Self Care

## 2023-01-30 SURGERY — COLONOSCOPY WITH PROPOFOL
Anesthesia: Monitor Anesthesia Care

## 2023-02-14 ENCOUNTER — Ambulatory Visit
Admission: RE | Admit: 2023-02-14 | Discharge: 2023-02-14 | Disposition: A | Discharge status: Home / Self Care | Payer: No Typology Code available for payment source | Source: Ambulatory Visit | Attending: Nurse Practitioner

## 2023-02-14 VITALS — BP 93/56 | HR 91 | Temp 100.1°F | Resp 16

## 2023-02-14 DIAGNOSIS — J069 Acute upper respiratory infection, unspecified: Secondary | ICD-10-CM | POA: Diagnosis not present

## 2023-02-14 DIAGNOSIS — J029 Acute pharyngitis, unspecified: Secondary | ICD-10-CM | POA: Diagnosis not present

## 2023-02-14 LAB — POC COVID19/FLU A&B COMBO
Covid Antigen, POC: NEGATIVE
Influenza A Antigen, POC: NEGATIVE
Influenza B Antigen, POC: NEGATIVE

## 2023-02-14 LAB — POCT RAPID STREP A (OFFICE): Rapid Strep A Screen: NEGATIVE

## 2023-02-14 MED ORDER — FLUTICASONE PROPIONATE 50 MCG/ACT NA SUSP: 2.0000 | Freq: Every day | NASAL | 0 refills | Status: AC

## 2023-02-14 MED ORDER — PROMETHAZINE-DM 6.25-15 MG/5ML PO SYRP: 5.0000 mL | ORAL_SOLUTION | Freq: Four times a day (QID) | ORAL | 0 refills | Status: AC | PRN

## 2023-02-14 NOTE — Discharge Instructions (Addendum)
The COVID/flu test and rapid strep test were negative. Take medication as prescribed.  The cough medication prescribed will make you drowsy.  No driving, drinking alcohol, or operating heavy machinery while taking medication.   May take over-the-counter Tylenol as needed for pain, fever, or general discomfort. Increase fluids and allow for plenty of rest. Warm salt water gargles 3-4 times daily as needed for throat pain or discomfort.  Also recommend the use of Chloraseptic throat spray, warm tea with honey and lemon, or throat lozenges for throat pain. Recommend the use of a humidifier in your bedroom at nighttime during sleep and sleeping elevated on pillows while cough symptoms persist. If symptoms are not improving over the next 7 to 10 days, or appear to be worsening, you may follow-up in this clinic or with your primary care physician for further evaluation. Follow-up as needed.

## 2023-02-14 NOTE — ED Provider Notes (Signed)
[facility]    CSN: [id number] Arrival date & time: 02/14/23  1749      History   Chief Complaint Chief Complaint  Patient presents with   Cough    chest congestion, productive cough, fever 99.6, chills, REALLY feel fatigued, - Entered by patient    HPI Jasmin Perez is a 73 y.o. female.   The history is provided by the patient.   Patient presents with a 1 day history of fever, bilateral ear pain, cough, sore throat, and chest congestion.  Tmax 100.1.  Denies headache, ear drainage, wheezing, difficulty breathing, chest pain, abdominal pain, nausea, vomiting, diarrhea, or rash.  Patient reports she has been taking over-the-counter cough and cold medication for her symptoms.  States that she has had a recent sick contact. Past Medical History:  Diagnosis Date   Past history of nut allergy     Patient Active Problem List   Diagnosis Date Noted   Family hx of colon cancer 05/19/2017    Past Surgical History:  Procedure Laterality Date   CHOLECYSTECTOMY     WRIST FRACTURE SURGERY Right     OB History   No obstetric history on file.      Home Medications    Prior to Admission medications   Medication Sig Start Date End Date Taking? Authorizing Provider  fluticasone (FLONASE) 50 MCG/ACT nasal spray Place 2 sprays into both nostrils daily. 02/14/23  Yes Leath-Warren, Sadie Haber, NP  promethazine-dextromethorphan (PROMETHAZINE-DM) 6.25-15 MG/5ML syrup Take 5 mLs by mouth 4 (four) times daily as needed. 02/14/23  Yes Leath-Warren, Sadie Haber, NP  Multiple Vitamin (MULTIVITAMIN) tablet Take 1 tablet by mouth daily.    [provider]    Family History Family History  Problem Relation Age of Onset   COPD Father    Cancer - Prostate Brother    Diabetes Brother     Social History Social History   Tobacco Use   Smoking status: Never   Smokeless tobacco: Never  Substance Use Topics   Alcohol use: No   Drug use: No     Allergies    Peanut-containing drug products   Review of Systems Review of Systems Per HPI  Physical Exam Triage Vital Signs ED Triage Vitals  Encounter Vitals Group     BP 02/14/23 1805 (!) 93/56     Systolic BP Percentile --      Diastolic BP Percentile --      Pulse Rate 02/14/23 1805 91     Resp 02/14/23 1805 16     Temp 02/14/23 1805 100.1 F (37.8 C)     Temp Source 02/14/23 1805 Oral     SpO2 02/14/23 1805 92 %     Weight --      Height --      Head Circumference --      Peak Flow --      Pain Score 02/14/23 1804 0     Pain Loc --      Pain Education --      Exclude from Growth Chart --    No data found.  Updated Vital Signs BP (!) 93/56 (BP Location: Right Arm)   Pulse 91   Temp 100.1 F (37.8 C) (Oral)   Resp 16   SpO2 92%   Visual Acuity Right Eye Distance:   Left Eye Distance:   Bilateral Distance:    Right Eye Near:   Left Eye Near:    Bilateral Near:  Physical Exam Vitals and nursing note reviewed.  Constitutional:      General: She is not in acute distress.    Appearance: Normal appearance.  HENT:     Head: Normocephalic.     Right Ear: Tympanic membrane, ear canal and external ear normal.     Left Ear: Tympanic membrane, ear canal and external ear normal.     Nose: Nose normal.     Mouth/Throat:     Lips: Pink.     Mouth: Mucous membranes are moist.     Pharynx: Uvula midline. Pharyngeal swelling, posterior oropharyngeal erythema and postnasal drip present. No oropharyngeal exudate or uvula swelling.     Tonsils: 1+ on the right. 1+ on the left.  Eyes:     Extraocular Movements: Extraocular movements intact.     Conjunctiva/sclera: Conjunctivae normal.     Pupils: Pupils are equal, round, and reactive to light.  Cardiovascular:     Rate and Rhythm: Normal rate and regular rhythm.     Pulses: Normal pulses.     Heart sounds: Normal heart sounds.  Pulmonary:     Effort: Pulmonary effort is normal.     Breath sounds: Normal breath sounds.   Abdominal:     General: Bowel sounds are normal.     Palpations: Abdomen is soft.     Tenderness: There is no abdominal tenderness.  Musculoskeletal:     Cervical back: Normal range of motion.  Lymphadenopathy:     Cervical: No cervical adenopathy.  Skin:    General: Skin is warm and dry.  Neurological:     General: No focal deficit present.     Mental Status: She is alert and oriented to person, place, and time.  Psychiatric:        Mood and Affect: Mood normal.        Behavior: Behavior normal.      UC Treatments / Results  Labs (all labs ordered are listed, but only abnormal results are displayed) Labs Reviewed  CULTURE, GROUP A STREP ([facility])  POC COVID19/FLU A&B COMBO  POCT RAPID STREP A (OFFICE)    EKG   Radiology No results found.  Procedures Procedures (including critical care time)  Medications Ordered in UC Medications - No data to display  Initial Impression / Assessment and Plan / UC Course  I have reviewed the triage vital signs and the nursing notes.  Pertinent labs & imaging results that were available during my care of the patient were reviewed by me and considered in my medical decision making (see chart for details).  The rapid strep test and COVID/flu test were negative.  Symptoms consistent with a viral URI with cough.  Will provide symptomatic treatment with Promethazine DM for cough at nighttime, and fluticasone 50 mcg nasal spray for postnasal drainage.  Supportive care recommendations were provided and discussed with the patient to include fluids, rest, use of a humidifier at nighttime during sleep, and sleeping elevated.  Discussed indications with the patient regarding follow-up.  Patient was in agreement with this plan of care and verbalizes understanding.  All questions were answered.  Patient stable for discharge. Stress Final Clinical Impressions(s) / UC Diagnoses   Final diagnoses:  Viral URI with cough  Sore throat     Discharge  Instructions      The COVID/flu test and rapid strep test were negative. Take medication as prescribed.  The cough medication prescribed will make you drowsy.  No driving, drinking alcohol, or operating heavy machinery  while taking medication.   May take over-the-counter Tylenol as needed for pain, fever, or general discomfort. Increase fluids and allow for plenty of rest. Warm salt water gargles 3-4 times daily as needed for throat pain or discomfort.  Also recommend the use of Chloraseptic throat spray, warm tea with honey and lemon, or throat lozenges for throat pain. Recommend the use of a humidifier in your bedroom at nighttime during sleep and sleeping elevated on pillows while cough symptoms persist. If symptoms are not improving over the next 7 to 10 days, or appear to be worsening, you may follow-up in this clinic or with your primary care physician for further evaluation. Follow-up as needed.     ED Prescriptions     Medication Sig Dispense Auth. Provider   promethazine-dextromethorphan (PROMETHAZINE-DM) 6.25-15 MG/5ML syrup Take 5 mLs by mouth 4 (four) times daily as needed. 118 mL Leath-Warren, Sadie Haber, NP   fluticasone (FLONASE) 50 MCG/ACT nasal spray Place 2 sprays into both nostrils daily. 16 g Leath-Warren, Sadie Haber, NP      PDMP not reviewed this encounter.   Abran Cantor, NP 02/14/23 1839

## 2023-02-14 NOTE — ED Triage Notes (Signed)
Pt reports she has a fever, mucus, bilateral ear pain and chest congestion x 1 day

## 2023-02-17 LAB — CULTURE, GROUP A STREP (THRC)

## 2023-05-02 IMAGING — CT CT ABD-PELV W/O CM
2 of 4 series · 17 of 46 positions shown, 19 images · non-contrast
Comparison: CT abdomen and pelvis 08/08/2015.

CLINICAL DATA: Onset right lower quadrant pain this morning.

EXAM:
CT ABDOMEN AND PELVIS WITHOUT CONTRAST
TECHNIQUE: Multidetector CT imaging of the abdomen and pelvis was performed
following the standard protocol without IV contrast.

[Series 2: axial st · axial · 0.83mm/px · z∈[+1006,+1376]mm · 14 of 84 slices shown, 16 images]
[im 5/84  soft-tissue]
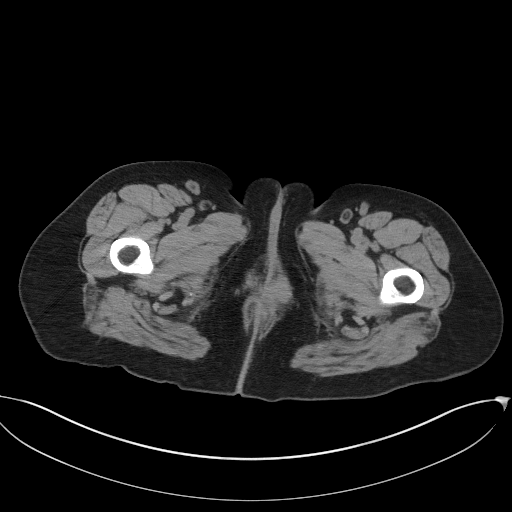
[im 5/84  bone]
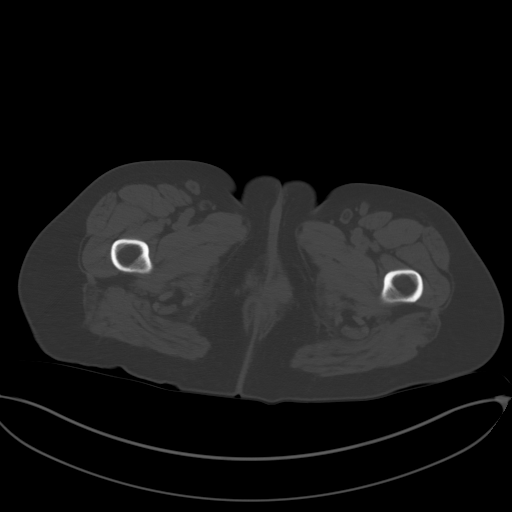
[im 9/84  soft-tissue]
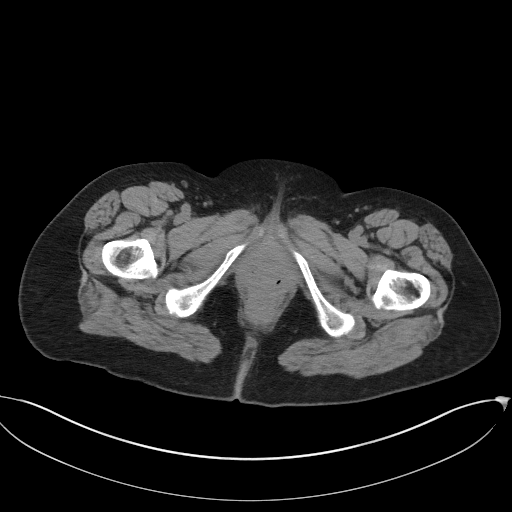
[im 18/84  soft-tissue]
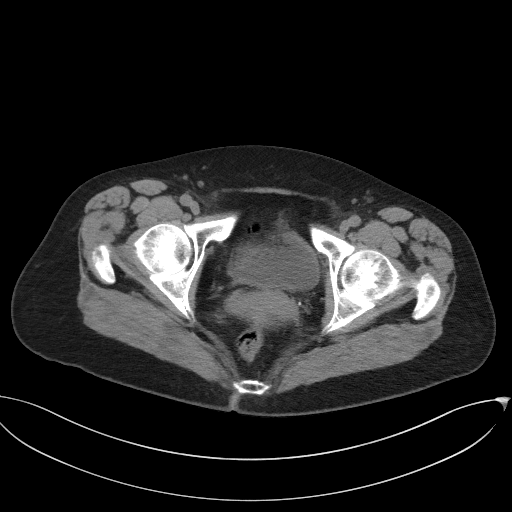
[im 22/84  soft-tissue]
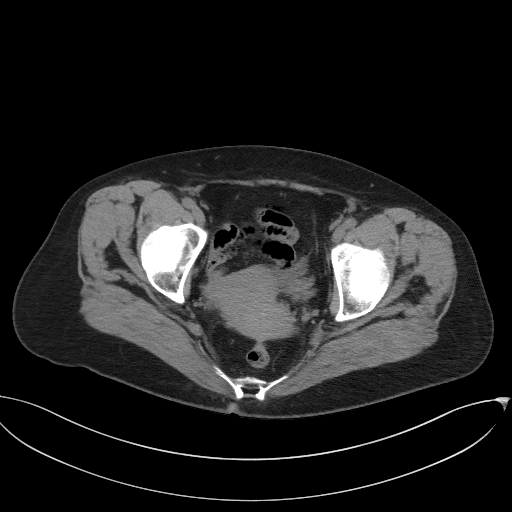
[im 27/84  soft-tissue]
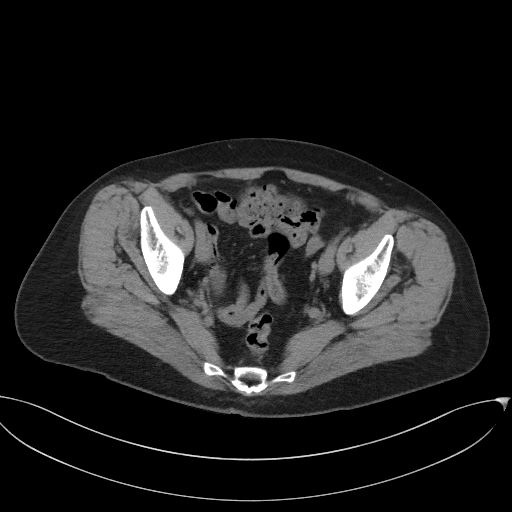
[im 35/84  soft-tissue]
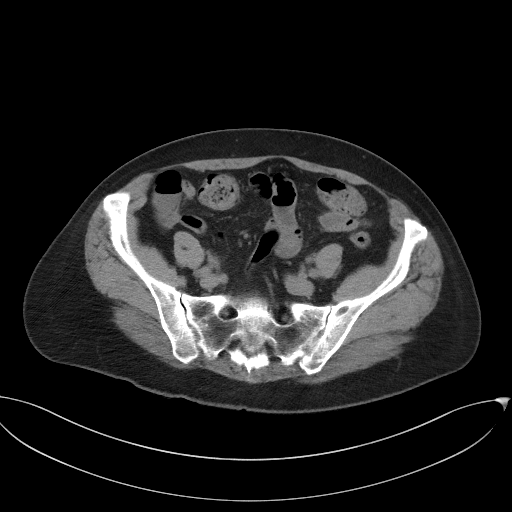
[im 40/84  soft-tissue]
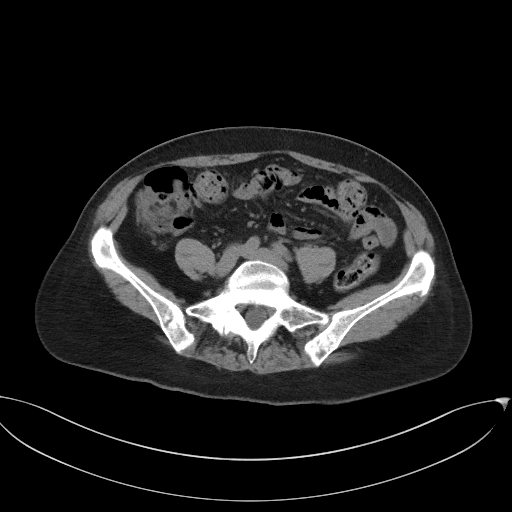
[im 44/84  soft-tissue]
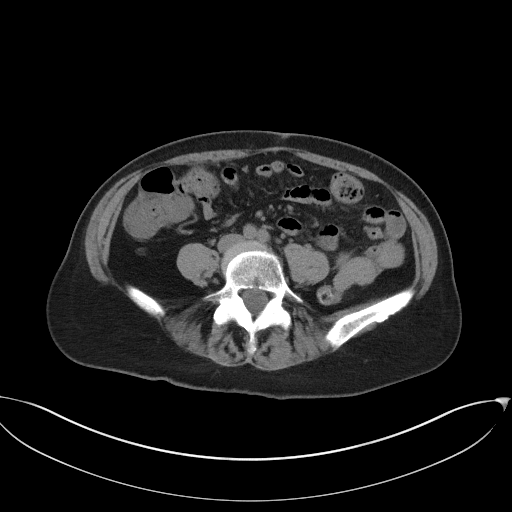
[im 49/84  soft-tissue]
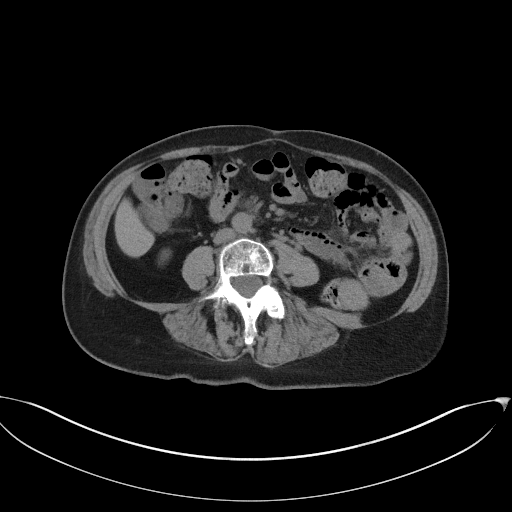
[im 49/84  bone]
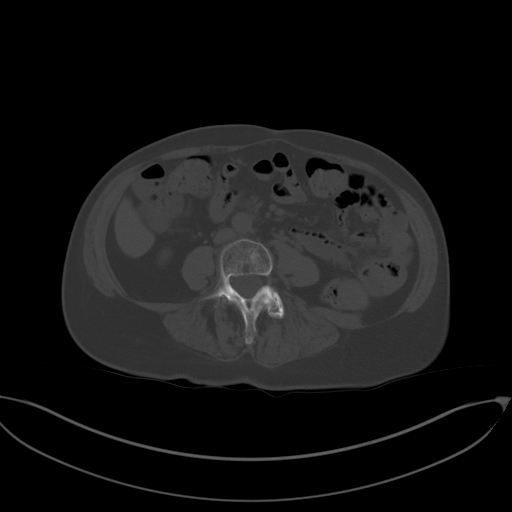
[im 57/84  soft-tissue]
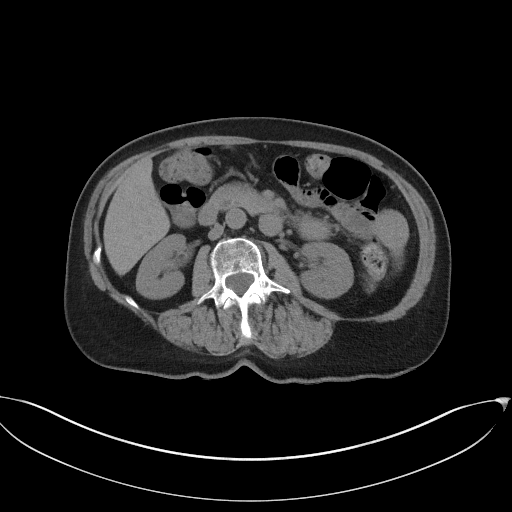
[im 62/84  soft-tissue]
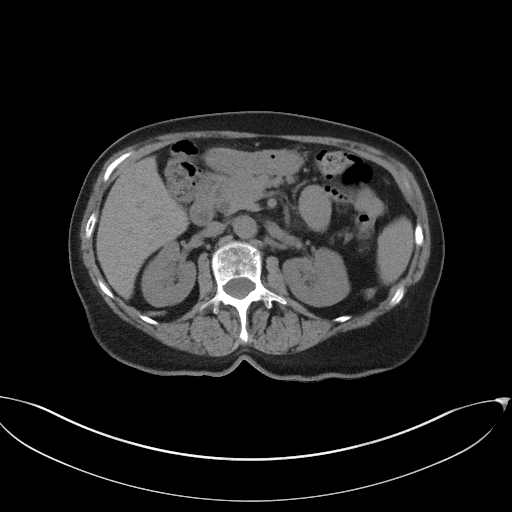
[im 66/84  soft-tissue]
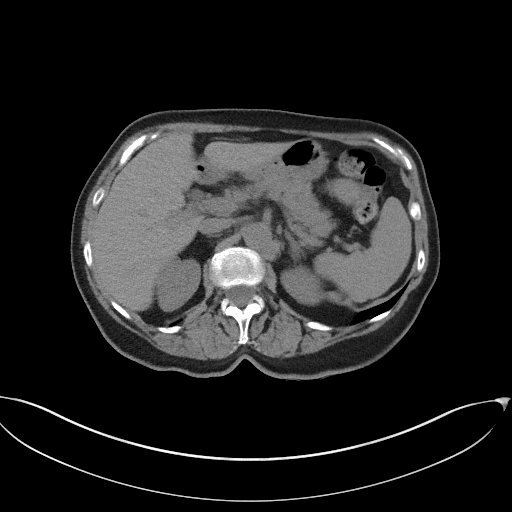
[im 75/84  soft-tissue]
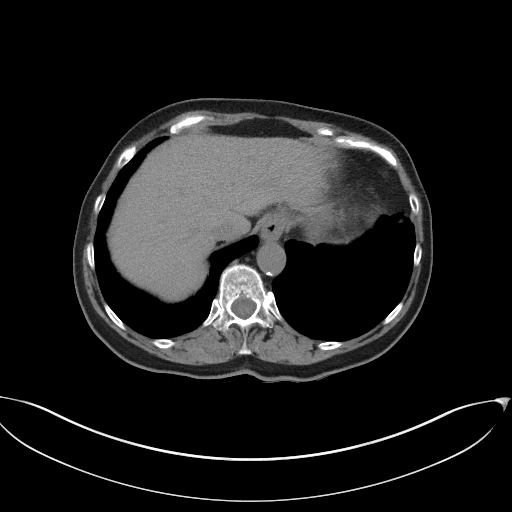
[im 79/84  soft-tissue]
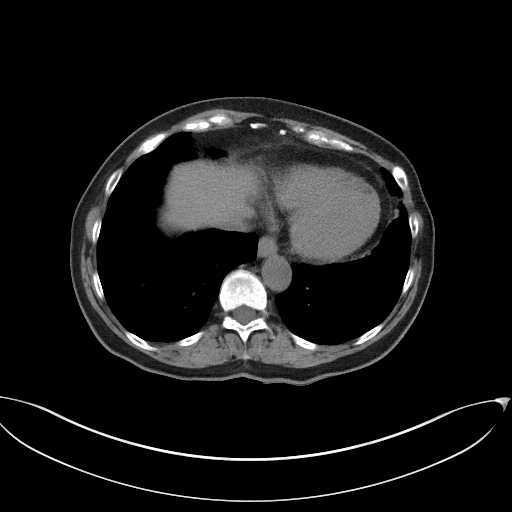

[Series 5: coronal st · coronal · 0.77mm/px · 3 of 95 slices shown]
[im 32/95  soft-tissue]
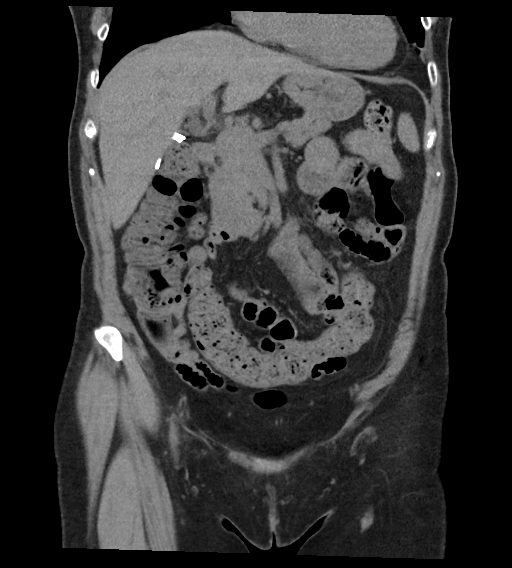
[im 42/95  soft-tissue]
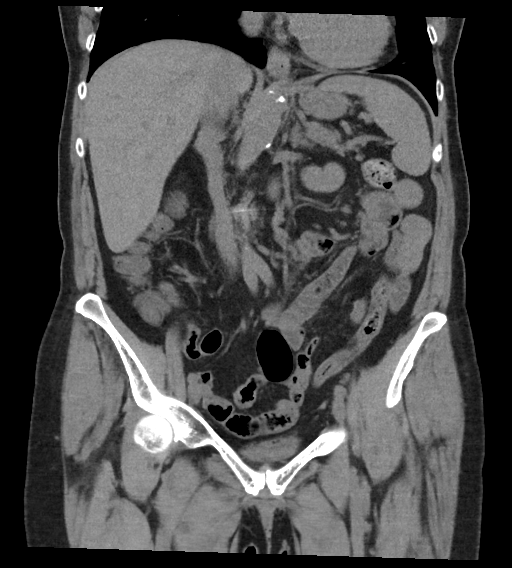
[im 53/95  soft-tissue]
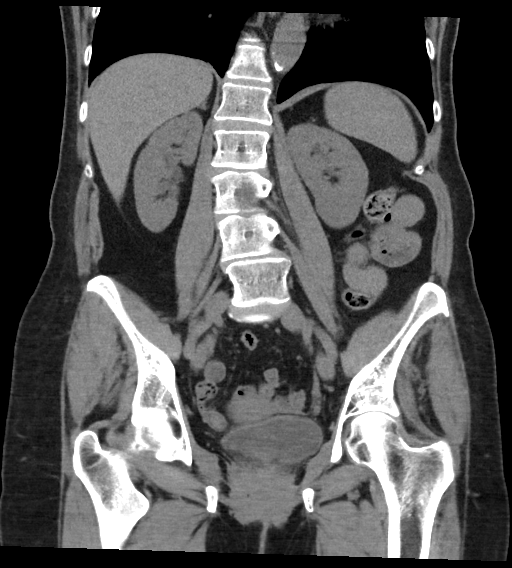

[17 of 46 positions shown; findings below may reference images not displayed]

FINDINGS: Lower chest: Minimal dependent atelectasis in the lung bases. No
pleural or pericardial effusion.

Hepatobiliary: No focal liver abnormality is seen. Status post
cholecystectomy. No biliary dilatation.

Pancreas: Unremarkable. No pancreatic ductal dilatation or
surrounding inflammatory changes.

Spleen: Normal in size without focal abnormality.

Adrenals/Urinary Tract: Adrenal glands appear normal. There is a
punctate nonobstructing stone in the upper pole of the right kidney.
No other urinary tract stones are identified. Negative for
hydronephrosis. Ureters and urinary bladder are negative.

Stomach/Bowel: Stomach is within normal limits. Appendix appears
normal. No evidence of bowel wall thickening, distention, or
inflammatory changes.

Vascular/Lymphatic: No significant vascular findings are present. No
enlarged abdominal or pelvic lymph nodes.

Reproductive: Uterus and bilateral adnexa are unremarkable.

Other: None.

Musculoskeletal: No acute or focal abnormality. Mild convex right
lumbar scoliosis and degenerative change noted.
IMPRESSION: No acute abnormality abdomen or pelvis. No finding to explain the
patient's symptoms.

Punctate nonobstructing stone upper pole right kidney.

## 2023-05-24 DIAGNOSIS — M79604 Pain in right leg: Secondary | ICD-10-CM | POA: Diagnosis not present

## 2023-05-24 DIAGNOSIS — Z008 Encounter for other general examination: Secondary | ICD-10-CM | POA: Diagnosis not present

## 2023-08-20 DIAGNOSIS — Z0000 Encounter for general adult medical examination without abnormal findings: Secondary | ICD-10-CM | POA: Diagnosis not present

## 2023-08-20 DIAGNOSIS — Z008 Encounter for other general examination: Secondary | ICD-10-CM | POA: Diagnosis not present

## 2023-09-22 DIAGNOSIS — D559 Anemia due to enzyme disorder, unspecified: Secondary | ICD-10-CM | POA: Diagnosis not present

## 2023-09-22 DIAGNOSIS — E785 Hyperlipidemia, unspecified: Secondary | ICD-10-CM | POA: Diagnosis not present

## 2023-09-22 DIAGNOSIS — Z1321 Encounter for screening for nutritional disorder: Secondary | ICD-10-CM | POA: Diagnosis not present

## 2023-09-22 DIAGNOSIS — R7303 Prediabetes: Secondary | ICD-10-CM | POA: Diagnosis not present

## 2023-09-22 DIAGNOSIS — Z1329 Encounter for screening for other suspected endocrine disorder: Secondary | ICD-10-CM | POA: Diagnosis not present

## 2023-09-29 DIAGNOSIS — Z6824 Body mass index (BMI) 24.0-24.9, adult: Secondary | ICD-10-CM | POA: Diagnosis not present

## 2023-09-29 DIAGNOSIS — E785 Hyperlipidemia, unspecified: Secondary | ICD-10-CM | POA: Diagnosis not present

## 2023-09-29 DIAGNOSIS — R7303 Prediabetes: Secondary | ICD-10-CM | POA: Diagnosis not present

## 2023-09-29 DIAGNOSIS — Z0001 Encounter for general adult medical examination with abnormal findings: Secondary | ICD-10-CM | POA: Diagnosis not present

## 2023-09-29 DIAGNOSIS — Z860100 Personal history of colon polyps, unspecified: Secondary | ICD-10-CM | POA: Diagnosis not present

## 2023-09-29 DIAGNOSIS — Z1331 Encounter for screening for depression: Secondary | ICD-10-CM | POA: Diagnosis not present

## 2023-09-29 DIAGNOSIS — M810 Age-related osteoporosis without current pathological fracture: Secondary | ICD-10-CM | POA: Diagnosis not present

## 2023-09-29 DIAGNOSIS — Z1389 Encounter for screening for other disorder: Secondary | ICD-10-CM | POA: Diagnosis not present
